# Patient Record
Sex: Male | Born: 1950 | Race: White | Hispanic: No | Marital: Single | Smoking: Never smoker
Health system: Southern US, Community
[De-identification: ages and names within clinical notes are randomized; demographics above are authoritative.]

## PROBLEM LIST (undated history)

## (undated) DIAGNOSIS — E119 Type 2 diabetes mellitus without complications: Secondary | ICD-10-CM

## (undated) DIAGNOSIS — M1990 Unspecified osteoarthritis, unspecified site: Secondary | ICD-10-CM

## (undated) DIAGNOSIS — H409 Unspecified glaucoma: Secondary | ICD-10-CM

## (undated) DIAGNOSIS — I2510 Atherosclerotic heart disease of native coronary artery without angina pectoris: Secondary | ICD-10-CM

## (undated) DIAGNOSIS — H269 Unspecified cataract: Secondary | ICD-10-CM

## (undated) DIAGNOSIS — R200 Anesthesia of skin: Secondary | ICD-10-CM

## (undated) DIAGNOSIS — I10 Essential (primary) hypertension: Secondary | ICD-10-CM

## (undated) DIAGNOSIS — E785 Hyperlipidemia, unspecified: Secondary | ICD-10-CM

## (undated) DIAGNOSIS — I428 Other cardiomyopathies: Secondary | ICD-10-CM

## (undated) DIAGNOSIS — K449 Diaphragmatic hernia without obstruction or gangrene: Secondary | ICD-10-CM

## (undated) DIAGNOSIS — F32A Depression, unspecified: Secondary | ICD-10-CM

## (undated) DIAGNOSIS — I5042 Chronic combined systolic (congestive) and diastolic (congestive) heart failure: Secondary | ICD-10-CM

## (undated) DIAGNOSIS — E1140 Type 2 diabetes mellitus with diabetic neuropathy, unspecified: Secondary | ICD-10-CM

## (undated) DIAGNOSIS — R2689 Other abnormalities of gait and mobility: Secondary | ICD-10-CM

## (undated) DIAGNOSIS — K222 Esophageal obstruction: Secondary | ICD-10-CM

## (undated) HISTORY — PX: COLONOSCOPY: SHX174

## (undated) HISTORY — DX: Unspecified osteoarthritis, unspecified site: M19.90

## (undated) HISTORY — DX: Depression, unspecified: F32.A

## (undated) HISTORY — DX: Atherosclerotic heart disease of native coronary artery without angina pectoris: I25.10

## (undated) HISTORY — DX: Anesthesia of skin: R20.0

## (undated) HISTORY — DX: Type 2 diabetes mellitus without complications: E11.9

## (undated) HISTORY — PX: GANGLION CYST EXCISION: SHX1691

## (undated) HISTORY — DX: Diaphragmatic hernia without obstruction or gangrene: K44.9

## (undated) HISTORY — PX: TONSILLECTOMY: SUR1361

## (undated) HISTORY — DX: Essential (primary) hypertension: I10

## (undated) HISTORY — DX: Hyperlipidemia, unspecified: E78.5

## (undated) HISTORY — PX: FRACTURE SURGERY: SHX138

## (undated) HISTORY — PX: CYST REMOVAL NECK: SHX6281

## (undated) HISTORY — DX: Unspecified glaucoma: H40.9

## (undated) HISTORY — DX: Esophageal obstruction: K22.2

## (undated) HISTORY — DX: Other cardiomyopathies: I42.8

## (undated) HISTORY — DX: Chronic combined systolic (congestive) and diastolic (congestive) heart failure: I50.42

## (undated) HISTORY — DX: Unspecified cataract: H26.9

## (undated) HISTORY — DX: Other abnormalities of gait and mobility: R26.89

---
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
## 2018-10-03 ENCOUNTER — Encounter: Payer: Self-pay | Admitting: Family Medicine

## 2018-10-03 ENCOUNTER — Ambulatory Visit (INDEPENDENT_AMBULATORY_CARE_PROVIDER_SITE_OTHER): Payer: Medicare Other | Admitting: Family Medicine

## 2018-10-03 ENCOUNTER — Ambulatory Visit (INDEPENDENT_AMBULATORY_CARE_PROVIDER_SITE_OTHER): Payer: Medicare Other

## 2018-10-03 ENCOUNTER — Encounter

## 2018-10-03 VITALS — BP 141/87 | HR 73 | Resp 18 | Ht 63.5 in | Wt 128.4 lb

## 2018-10-03 DIAGNOSIS — R6889 Other general symptoms and signs: Secondary | ICD-10-CM

## 2018-10-03 DIAGNOSIS — Z23 Encounter for immunization: Secondary | ICD-10-CM | POA: Diagnosis not present

## 2018-10-03 DIAGNOSIS — R2242 Localized swelling, mass and lump, left lower limb: Secondary | ICD-10-CM

## 2018-10-03 DIAGNOSIS — I10 Essential (primary) hypertension: Secondary | ICD-10-CM | POA: Diagnosis not present

## 2018-10-03 DIAGNOSIS — E1159 Type 2 diabetes mellitus with other circulatory complications: Secondary | ICD-10-CM | POA: Diagnosis not present

## 2018-10-03 DIAGNOSIS — R5381 Other malaise: Secondary | ICD-10-CM

## 2018-10-03 DIAGNOSIS — R202 Paresthesia of skin: Secondary | ICD-10-CM

## 2018-10-03 DIAGNOSIS — M898X6 Other specified disorders of bone, lower leg: Secondary | ICD-10-CM | POA: Diagnosis not present

## 2018-10-03 DIAGNOSIS — R413 Other amnesia: Secondary | ICD-10-CM

## 2018-10-03 DIAGNOSIS — Z1389 Encounter for screening for other disorder: Secondary | ICD-10-CM | POA: Diagnosis not present

## 2018-10-03 DIAGNOSIS — R2689 Other abnormalities of gait and mobility: Secondary | ICD-10-CM | POA: Diagnosis not present

## 2018-10-03 DIAGNOSIS — R200 Anesthesia of skin: Secondary | ICD-10-CM

## 2018-10-03 LAB — POCT URINALYSIS DIP (CLINITEK)
Bilirubin, UA: NEGATIVE
Blood, UA: NEGATIVE
Glucose, UA: NEGATIVE mg/dL
Ketones, POC UA: NEGATIVE mg/dL
Leukocytes, UA: NEGATIVE
Nitrite, UA: NEGATIVE
POC PROTEIN,UA: NEGATIVE
SPEC GRAV UA: 1.02 (ref 1.010–1.025)
Urobilinogen, UA: 0.2 E.U./dL
pH, UA: 5 (ref 5.0–8.0)

## 2018-10-03 LAB — GLUCOSE, POCT (MANUAL RESULT ENTRY): POC Glucose: 104 mg/dl — AB (ref 70–99)

## 2018-10-03 MED ORDER — LISINOPRIL 40 MG PO TABS: 40.0000 mg | ORAL_TABLET | Freq: Every day | ORAL | 2 refills | Status: DC

## 2018-10-03 MED ORDER — CARVEDILOL 3.125 MG PO TABS: 3.1250 mg | ORAL_TABLET | Freq: Two times a day (BID) | ORAL | 2 refills | Status: DC

## 2018-10-03 MED ORDER — METFORMIN HCL 1000 MG PO TABS: 1000.0000 mg | ORAL_TABLET | Freq: Two times a day (BID) | ORAL | 2 refills | Status: DC

## 2018-10-03 MED ORDER — ATORVASTATIN CALCIUM 10 MG PO TABS: 10.0000 mg | ORAL_TABLET | Freq: Every day | ORAL | 2 refills | Status: DC

## 2018-10-03 NOTE — Progress Notes (Signed)
Cody [name], is a 68 y.o. male  EUM:[id number]  VQM:[id number]  DOB - Jun 10, 1951  CC:  Chief Complaint  Patient presents with  . Establish Care  . Diabetes    numbness/tingling in feet, polydipsia, floaters  . Foot Pain    L foot pain. broke 2 toes 3 months ago.       HPI: Cody [name] is a 68 y.o. male is here today to establish care.   Cody [name] patient is a poor historian and has no available medical records present with him during visit today.  Is accompanied by his brother who brings a bag of medications.  He recently moved his brother down from [place] due to altered memory, impaired balance, frequent falling, poor self-care. He is awaiting receipt of Medicaid and family plans to place him in an assisted living facility.  Patient endorses forgetfulness and has been off medications for months.  He is currently prescribed medications for diabetes, hyperlipidemia, possible heart failure, hypertension.  At present he is only taking metformin.  He is uncertain of what his last hemoglobin A1c was.  He also complains of bilateral foot pain and numbness.  He dropped 2 fans on his left foot over 3 months ago and suffered a fracture to the fourth and fifth metatarsal.  Reports he saw someone about the fractures although no treatment was provided.  Reports being hospitalized for foot swelling although cannot provide any additional details regarding the hospitalization. He complains of persistent dizziness, weakness and numbness and angling of bilateral feet.  He denies polyuria polydipsia.  He has trouble eating stairs.  He is currently residing with his brother and has had multiple falls within the last 3 months.  Denies any head injury.   He denies chest pain, shortness of breath, persistent cough, chest tightness, denies history prior stroke, denies history of prior MI. Current medications: Current Outpatient Medications:  .  metFORMIN (GLUCOPHAGE) 1000 MG tablet, Take 1,000 mg by mouth 2  (two) times daily with a meal., Disp: , Rfl:  .  Ascorbic Acid (VITAMIN C) 1000 MG tablet, Take 1,000 mg by mouth daily., Disp: , Rfl:  .  atorvastatin (LIPITOR) 10 MG tablet, Take 10 mg by mouth daily., Disp: , Rfl:  .  B Complex-C (SUPER B COMPLEX PO), Take 1 capsule by mouth daily., Disp: , Rfl:  .  carvedilol (COREG) 3.125 MG tablet, Take 3.125 mg by mouth 2 (two) times daily with a meal., Disp: , Rfl:  .  Cholecalciferol (VITAMIN D-3) 25 MCG (1000 UT) CAPS, Take 1 capsule by mouth daily., Disp: , Rfl:  .  famotidine (PEPCID) 20 MG tablet, Take 20 mg by mouth 2 (two) times daily., Disp: , Rfl:  .  ibuprofen (ADVIL,MOTRIN) 600 MG tablet, Take 600 mg by mouth every 6 (six) hours as needed., Disp: , Rfl:  .  lisinopril (PRINIVIL,ZESTRIL) 40 MG tablet, Take 40 mg by mouth daily., Disp: , Rfl:  .  Omega-3 Fatty Acids (FISH OIL) 1200 MG CAPS, Take 1 capsule by mouth daily., Disp: , Rfl:  .  spironolactone (ALDACTONE) 25 MG tablet, Take 25 mg by mouth daily., Disp: , Rfl:    Pertinent family medical history: family history includes Diabetes in his brother; Hypertension in his brother.   No Known Allergies  Social History   Socioeconomic History  . Marital status: Single    Spouse name: Not on file  . Number of children: Not on file  . Years of education: Not on file  .  Highest education level: Not on file  Occupational History  . Not on file  Social Needs  . Financial resource strain: Not on file  . Food insecurity:    Worry: Not on file    Inability: Not on file  . Transportation needs:    Medical: Not on file    Non-medical: Not on file  Tobacco Use  . Smoking status: Never Smoker  . Smokeless tobacco: Current User    Types: Chew  Substance and Sexual Activity  . Alcohol use: Not Currently  . Drug use: Never  . Sexual activity: Not on file  Lifestyle  . Physical activity:    Days per week: Not on file    Minutes per session: Not on file  . Stress: Not on file   Relationships  . Social connections:    Talks on phone: Not on file    Gets together: Not on file    Attends religious service: Not on file    Active member of club or organization: Not on file    Attends meetings of clubs or organizations: Not on file    Relationship status: Not on file  . Intimate partner violence:    Fear of current or ex partner: Not on file    Emotionally abused: Not on file    Physically abused: Not on file    Forced sexual activity: Not on file  Other Topics Concern  . Not on file  Social History Narrative  . Not on file    Review of Systems: Pertinent negatives listed in HPI Objective:   Vitals:   10/03/18 0941  BP: (!) 150/93  Pulse: 73  Resp: 18  SpO2: 97%    BP Readings from Last 3 Encounters:  10/03/18 (!) 150/93    Filed Weights   10/03/18 0941  Weight: 128 lb 6.4 oz (58.2 kg)      Physical Exam: Constitutional: Patient appears well-developed and well-nourished. No distress. HENT: Normocephalic, atraumatic, External right and left ear normal.  Eyes: Conjunctivae and EOM are normal. PERRLA, no scleral icterus. Neck: Normal ROM. Neck supple. No JVD. No tracheal deviation. No thyromegaly. CVS: RRR, S1/S2 +, no murmurs, no gallops, no carotid bruit.  Pulmonary: Effort and breath sounds normal, no stridor, rhonchi, wheezes, rales.  Abdominal: Soft. BS +, no distension, tenderness, rebound or guarding.  Musculoskeletal: Edema noted left 4th and 5th digit. DP pulse +2  Neuro: Alert. Antalgic gait, BUE and BLE strength 5/5. Bilateral hand grips symmetrical. Skin: Skin is warm and dry. No rash noted. Not diaphoretic. No erythema. No pallor. Psychiatric: Normal mood and affect. Behavior, judgment, thought content normal.     Assessment and plan:  1. Essential hypertension, uncontrolled today -Resume lisinopril 40 mg. Hold aldactone pending review of labs. Continue carvedilol 3.125 BID.   We have discussed target BP range and blood pressure  goal. I have advised patient to check BP regularly and to call us back or report to clinic if the numbers are consistently higher than 140/90. We discussed the importance of compliance with medical therapy and DASH diet recommended, consequences of uncontrolled hypertension discussed.  Checking the following: - POCT URINALYSIS DIP (CLINITEK) - Recheck vitals  2. Type 2 diabetes mellitus with other circulatory complication, without long-term current use of insulin ([place]) For now continue metformin and recommend resuming Lipitor.  Checking the following: - Glucose (CBG) - Microalbumin/Creatinine Ratio, Urine - Hemoglobin A1c - Comprehensive metabolic panel - Lipid panel  3. Localized swelling of left  foot - DG Foot Complete Left; Future  4. Abnormality of gait due to impairment of balance 5. Memory impairment -Referral placed to neurology for further work-up   6. Alteration in self-care ability -Patient is awaiting Medicaid. Family will need FL-2   7. Bilateral foot numbness and tingling  A1C 5.6, well-controlled. Unknown cause of neuropathy symptoms. Referring to neurology for further evaluation.    Meds ordered this encounter  Medications  . metFORMIN (GLUCOPHAGE) 1000 MG tablet    Sig: Take 1 tablet (1,000 mg total) by mouth 2 (two) times daily with a meal.    Dispense:  60 tablet    Refill:  2  . lisinopril (PRINIVIL,ZESTRIL) 40 MG tablet    Sig: Take 1 tablet (40 mg total) by mouth daily.    Dispense:  30 tablet    Refill:  2  . carvedilol (COREG) 3.125 MG tablet    Sig: Take 1 tablet (3.125 mg total) by mouth 2 (two) times daily with a meal.    Dispense:  60 tablet    Refill:  2  . atorvastatin (LIPITOR) 10 MG tablet    Sig: Take 1 tablet (10 mg total) by mouth daily.    Dispense:  30 tablet    Refill:  2    Return in about 2 weeks (around 10/17/2018) for FL2 and medication follow-up .   The patient was given clear instructions to go to ER or return to medical  center if symptoms don't improve, worsen or new problems develop. The patient verbalized understanding. The patient was advised  to call and obtain lab results if they haven't heard anything from out office within 7-10 business days.  Cody Barrows, FNP Primary [facility] at [facility] [address], [place] [address] [phone]: [phone]    This note has been created with Dragon speech recognition software and Engineer, materials. Any transcriptional errors are unintentional.

## 2018-10-03 NOTE — Progress Notes (Signed)
Needs FL2 memory, gait problem, balance issues and medication problems

## 2018-10-04 LAB — MICROALBUMIN / CREATININE URINE RATIO
Creatinine, Urine: 121.3 mg/dL
Microalb/Creat Ratio: 56 mg/g creat — ABNORMAL HIGH (ref 0–29)
Microalbumin, Urine: 68.5 ug/mL

## 2018-10-04 LAB — COMPREHENSIVE METABOLIC PANEL
ALT: 26 IU/L (ref 0–44)
AST: 22 IU/L (ref 0–40)
Albumin/Globulin Ratio: 2 (ref 1.2–2.2)
Albumin: 4.6 g/dL (ref 3.8–4.8)
Alkaline Phosphatase: 79 IU/L (ref 39–117)
BUN/Creatinine Ratio: 24 (ref 10–24)
BUN: 24 mg/dL (ref 8–27)
Bilirubin Total: 0.5 mg/dL (ref 0.0–1.2)
CALCIUM: 10.1 mg/dL (ref 8.6–10.2)
CO2: 21 mmol/L (ref 20–29)
Chloride: 103 mmol/L (ref 96–106)
Creatinine, Ser: 1.02 mg/dL (ref 0.76–1.27)
GFR calc Af Amer: 87 mL/min/{1.73_m2} (ref >59)
GFR calc non Af Amer: 75 mL/min/{1.73_m2} (ref >59)
Globulin, Total: 2.3 g/dL (ref 1.5–4.5)
Glucose: 104 mg/dL — ABNORMAL HIGH (ref 65–99)
Potassium: 5 mmol/L (ref 3.5–5.2)
Sodium: 143 mmol/L (ref 134–144)
Total Protein: 6.9 g/dL (ref 6.0–8.5)

## 2018-10-04 LAB — LIPID PANEL
Chol/HDL Ratio: 6.4 ratio — ABNORMAL HIGH (ref 0.0–5.0)
Cholesterol, Total: 242 mg/dL — ABNORMAL HIGH (ref 100–199)
HDL: 38 mg/dL — ABNORMAL LOW (ref >39)
LDL Calculated: 161 mg/dL — ABNORMAL HIGH (ref 0–99)
Triglycerides: 215 mg/dL — ABNORMAL HIGH (ref 0–149)
VLDL Cholesterol Cal: 43 mg/dL — ABNORMAL HIGH (ref 5–40)

## 2018-10-04 LAB — HEMOGLOBIN A1C
Est. average glucose Bld gHb Est-mCnc: 114 mg/dL
Hgb A1c MFr Bld: 5.6 % (ref 4.8–5.6)

## 2018-10-09 DIAGNOSIS — R6889 Other general symptoms and signs: Secondary | ICD-10-CM | POA: Insufficient documentation

## 2018-10-09 DIAGNOSIS — I10 Essential (primary) hypertension: Secondary | ICD-10-CM | POA: Insufficient documentation

## 2018-10-09 DIAGNOSIS — R413 Other amnesia: Secondary | ICD-10-CM | POA: Insufficient documentation

## 2018-10-09 DIAGNOSIS — R2689 Other abnormalities of gait and mobility: Secondary | ICD-10-CM | POA: Insufficient documentation

## 2018-10-09 DIAGNOSIS — R2242 Localized swelling, mass and lump, left lower limb: Secondary | ICD-10-CM | POA: Insufficient documentation

## 2018-10-09 DIAGNOSIS — E119 Type 2 diabetes mellitus without complications: Secondary | ICD-10-CM | POA: Insufficient documentation

## 2018-10-09 DIAGNOSIS — R5381 Other malaise: Secondary | ICD-10-CM | POA: Insufficient documentation

## 2018-10-16 ENCOUNTER — Telehealth: Payer: Self-pay | Admitting: Neurology

## 2018-10-16 ENCOUNTER — Ambulatory Visit (INDEPENDENT_AMBULATORY_CARE_PROVIDER_SITE_OTHER): Payer: Medicare Other | Admitting: Neurology

## 2018-10-16 ENCOUNTER — Encounter: Payer: Self-pay | Admitting: Neurology

## 2018-10-16 VITALS — BP 135/75 | HR 78 | Ht 63.5 in | Wt 131.0 lb

## 2018-10-16 DIAGNOSIS — R413 Other amnesia: Secondary | ICD-10-CM

## 2018-10-16 DIAGNOSIS — E538 Deficiency of other specified B group vitamins: Secondary | ICD-10-CM | POA: Diagnosis not present

## 2018-10-16 DIAGNOSIS — R2689 Other abnormalities of gait and mobility: Secondary | ICD-10-CM

## 2018-10-16 DIAGNOSIS — R5382 Chronic fatigue, unspecified: Secondary | ICD-10-CM | POA: Diagnosis not present

## 2018-10-16 DIAGNOSIS — R269 Unspecified abnormalities of gait and mobility: Secondary | ICD-10-CM

## 2018-10-16 NOTE — Progress Notes (Signed)
Reason for visit: Gait disturbance, memory disturbance  Referring physician: Dr. [name] is a 68 y.o. male  History of present illness:  [name] is a 68 year old right-handed white male with a history of hypertension, dyslipidemia, and diabetes.  The patient has recently moved from [place] to this area to live with his brother.  The patient is a very poor historian, he has developed memory problems at some point, the brother believes that this is been a problem for least a year.  The patient goes on to say that he had an MRI of the brain in [place] several years ago for memory problems.  The patient has had some problems with his balance and walking that dates back at least a year as well, the patient claims he has been using a cane for that duration.  The patient has a walker as well, but he continues to fall on a regular basis when he is not using these devices.  The patient reports numbness in both feet without significant pain, he reports the numbness goes across the ankles.  He denies neck pain or low back pain.  He has not had any weakness of the extremities, he denies issues controlling the bowels or the bladder.  The patient was living alone prior to his move here, he was not taking his medications properly, he was not keeping up with appointments.  He was able to keep up with his finances.  He has not been operating a motor vehicle since being in this area.  The patient was seen by his primary care physician, blood work was done, hemoglobin A1c was 5.6.  The patient himself reports no episodes of hypoglycemia.  He comes to this office for an evaluation.  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  Past Medical History:  Diagnosis Date  . Balance problem   . Diabetes mellitus without complication ([place])   . Hyperlipidemia   . Hypertension   . Numbness     Past Surgical History:  Procedure Laterality Date  . CYST  REMOVAL NECK     States he was young  . FRACTURE SURGERY     Left foot   . GANGLION CYST EXCISION Left   . TONSILLECTOMY      Family History  Problem Relation Age of Onset  . Diabetes Brother   . Hypertension Brother   . Obesity Mother   . Heart failure Father     Social history:  reports that he has never smoked. His smokeless tobacco use includes chew. He reports previous alcohol use. He reports that he does not use drugs.  Medications:  Prior to Admission medications   Medication Sig Start Date End Date Taking? Authorizing Provider  Ascorbic Acid (VITAMIN C) 1000 MG tablet Take 1,000 mg by mouth daily.   Yes [provider]  atorvastatin (LIPITOR) 10 MG tablet Take 1 tablet (10 mg total) by mouth daily. 10/03/18  Yes Scot Jun, FNP  carvedilol (COREG) 3.125 MG tablet Take 1 tablet (3.125 mg total) by mouth 2 (two) times daily with a meal. 10/03/18  Yes Scot Jun, FNP  lisinopril (PRINIVIL,ZESTRIL) 40 MG tablet Take 1 tablet (40 mg total) by mouth daily. 10/03/18  Yes Scot Jun, FNP  metFORMIN (GLUCOPHAGE) 1000 MG tablet Take 1 tablet (1,000 mg total) by mouth 2 (two) times daily with a meal. 10/03/18  Yes Scot Jun, FNP  Omega-3 Fatty  Acids (FISH OIL) 1200 MG CAPS Take 1 capsule by mouth daily.   Yes [provider]  spironolactone (ALDACTONE) 25 MG tablet Take 25 mg by mouth daily. Hold medication pending labs    Yes [provider]     No Known Allergies  ROS:  Out of a complete 14 system review of symptoms, the patient complains only of the following symptoms, and all other reviewed systems are negative.  Gait disorder Memory problems  Blood pressure 135/75, pulse 78, height 5' 3.5" (1.613 m), weight 131 lb (59.4 kg).  Physical Exam  General: The patient is alert and cooperative at the time of the examination.  Eyes: Pupils are equal, round, and reactive to light. Discs are flat bilaterally.  Neck: The  neck is supple, no carotid bruits are noted.  Respiratory: The respiratory examination is clear.  Cardiovascular: The cardiovascular examination reveals a regular rate and rhythm, no obvious murmurs or rubs are noted.  Skin: Extremities are without significant edema.  Neurologic Exam  Mental status: The patient is alert and oriented x 3 at the time of the examination. The Mini-Mental status examination done today shows a total score of 21/30.  Cranial nerves: Facial symmetry is present. There is good sensation of the face to pinprick and soft touch bilaterally. The strength of the facial muscles and the muscles to head turning and shoulder shrug are normal bilaterally. Speech is slow, slightly dysarthric, not aphasic. Extraocular movements are full. Visual fields are full. The tongue is midline, and the patient has symmetric elevation of the soft palate. No obvious hearing deficits are noted.  Motor: The motor testing reveals 5 over 5 strength of all 4 extremities, with exception of some slight weakness of the intrinsic muscles of the left hand and hip flexion on the right. Good symmetric motor tone is noted throughout.  Sensory: Sensory testing is intact to pinprick, soft touch, vibration sensation, and position sense on the upper extremities.  With the lower extremities, there is no definite stocking pattern pinprick sensory deficit, the patient has symmetric vibration sensation in the feet, some impairment of position sense in both feet no evidence of extinction is noted.  Coordination: Cerebellar testing reveals good finger-nose-finger bilaterally, but the patient has dysmetria with heel-to-shin bilaterally.  Gait and station: Gait is wide-based, very unsteady, the patient cannot walk well independently.  Tandem gait was not attempted.  Romberg is negative.  Reflexes: Deep tendon reflexes are symmetric and normal bilaterally, with exception of depression of the ankle jerk reflexes  bilaterally. Toes are downgoing bilaterally.   Assessment/Plan:  1.  Memory disorder  2.  Gait disorder  3.  History of stuttering speech pattern  The patient has had some decline in his physical abilities to ambulate and with memory over the last year or 2.  The patient will be set up for blood work today, he will have MRI of the brain, he will undergo nerve conduction studies of both legs and EMG of one leg.  I have recommended physical therapy, the patient is not sure that he wants to pursue this.  He will follow-up in 4 months, we will consider addition of a medication for memory at that time.  Jill Alexanders MD 10/16/2018 3:47 PM  [facility] [address] [place], [place] [address]  Phone [phone] Fax [phone]

## 2018-10-16 NOTE — Telephone Encounter (Signed)
Medicare/medicaid order sent to GI. They will reach out to the pt to schedule.  °

## 2018-10-17 ENCOUNTER — Encounter: Payer: Self-pay | Admitting: Family Medicine

## 2018-10-17 ENCOUNTER — Telehealth: Payer: Self-pay

## 2018-10-17 ENCOUNTER — Ambulatory Visit (INDEPENDENT_AMBULATORY_CARE_PROVIDER_SITE_OTHER): Payer: Medicare Other | Admitting: Family Medicine

## 2018-10-17 VITALS — BP 144/75 | HR 63 | Resp 18 | Ht 63.5 in | Wt 131.0 lb

## 2018-10-17 DIAGNOSIS — E1159 Type 2 diabetes mellitus with other circulatory complications: Secondary | ICD-10-CM | POA: Diagnosis not present

## 2018-10-17 DIAGNOSIS — Z8669 Personal history of other diseases of the nervous system and sense organs: Secondary | ICD-10-CM

## 2018-10-17 DIAGNOSIS — F331 Major depressive disorder, recurrent, moderate: Secondary | ICD-10-CM

## 2018-10-17 DIAGNOSIS — E785 Hyperlipidemia, unspecified: Secondary | ICD-10-CM | POA: Diagnosis not present

## 2018-10-17 DIAGNOSIS — F411 Generalized anxiety disorder: Secondary | ICD-10-CM

## 2018-10-17 DIAGNOSIS — R7989 Other specified abnormal findings of blood chemistry: Secondary | ICD-10-CM

## 2018-10-17 LAB — SEDIMENTATION RATE: Sed Rate: 4 mm/hr (ref 0–30)

## 2018-10-17 LAB — TSH: TSH: 7.12 u[IU]/mL — ABNORMAL HIGH (ref 0.450–4.500)

## 2018-10-17 LAB — CBC WITH DIFFERENTIAL/PLATELET
Basophils Absolute: 0.1 10*3/uL (ref 0.0–0.2)
Basos: 1 %
EOS (ABSOLUTE): 0.3 10*3/uL (ref 0.0–0.4)
Eos: 4 %
Hematocrit: 38.1 % (ref 37.5–51.0)
Hemoglobin: 12.8 g/dL — ABNORMAL LOW (ref 13.0–17.7)
IMMATURE GRANULOCYTES: 0 %
Immature Grans (Abs): 0 10*3/uL (ref 0.0–0.1)
LYMPHS: 17 %
Lymphocytes Absolute: 1.2 10*3/uL (ref 0.7–3.1)
MCH: 33.3 pg — ABNORMAL HIGH (ref 26.6–33.0)
MCHC: 33.6 g/dL (ref 31.5–35.7)
MCV: 99 fL — ABNORMAL HIGH (ref 79–97)
Monocytes Absolute: 0.7 10*3/uL (ref 0.1–0.9)
Monocytes: 11 %
NEUTROS PCT: 67 %
Neutrophils Absolute: 4.6 10*3/uL (ref 1.4–7.0)
Platelets: 211 10*3/uL (ref 150–450)
RBC: 3.84 x10E6/uL — AB (ref 4.14–5.80)
RDW: 12.1 % (ref 11.6–15.4)
WBC: 6.8 10*3/uL (ref 3.4–10.8)

## 2018-10-17 LAB — RPR: RPR Ser Ql: NONREACTIVE

## 2018-10-17 LAB — HIV ANTIBODY (ROUTINE TESTING W REFLEX): HIV Screen 4th Generation wRfx: NONREACTIVE

## 2018-10-17 LAB — VITAMIN B12: Vitamin B-12: 370 pg/mL (ref 232–1245)

## 2018-10-17 LAB — GLUCOSE, POCT (MANUAL RESULT ENTRY): POC Glucose: 100 mg/dl — AB (ref 70–99)

## 2018-10-17 MED ORDER — CARVEDILOL 3.125 MG PO TABS: 3.1250 mg | ORAL_TABLET | Freq: Two times a day (BID) | ORAL | 2 refills | Status: DC

## 2018-10-17 MED ORDER — ATORVASTATIN CALCIUM 10 MG PO TABS: 10.0000 mg | ORAL_TABLET | Freq: Every day | ORAL | 2 refills | Status: DC

## 2018-10-17 MED ORDER — BUSPIRONE HCL 15 MG PO TABS: 15.0000 mg | ORAL_TABLET | Freq: Two times a day (BID) | ORAL | 1 refills | Status: DC

## 2018-10-17 MED ORDER — METFORMIN HCL 1000 MG PO TABS: 1000.0000 mg | ORAL_TABLET | Freq: Every day | ORAL | 2 refills | Status: DC

## 2018-10-17 MED ORDER — SPIRONOLACTONE 25 MG PO TABS: 25.0000 mg | ORAL_TABLET | Freq: Every day | ORAL | 1 refills | Status: DC

## 2018-10-17 MED ORDER — ESCITALOPRAM OXALATE 10 MG PO TABS: 10.0000 mg | ORAL_TABLET | Freq: Every day | ORAL | 0 refills | Status: DC

## 2018-10-17 MED ORDER — LISINOPRIL 40 MG PO TABS: 40.0000 mg | ORAL_TABLET | Freq: Every day | ORAL | 2 refills | Status: DC

## 2018-10-17 NOTE — Telephone Encounter (Signed)
I called pt and discussed his lab work with him. Pt reports that he just came from his PCP's office and is aware of these results. Pt verbalized understanding of results. Pt had no questions at this time but was encouraged to call back if questions arise.

## 2018-10-17 NOTE — Patient Instructions (Addendum)

## 2018-10-17 NOTE — Telephone Encounter (Signed)
-----   Message from Kathrynn Ducking, MD sent at 10/17/2018  7:57 AM EST ----- Blood work is unremarkable exception of a slightly low thyroid (elevated TSH), there appears to be a mild anemia.  These issues will need to be followed, I will send blood work to primary care physician.  Please call the patient. ----- Message ----- From: Lavone Neri Lab Results In Sent: 10/17/2018   7:38 AM EST To: Kathrynn Ducking, MD

## 2018-10-17 NOTE — Progress Notes (Signed)
Printed and discussed during office visit

## 2018-10-17 NOTE — Progress Notes (Signed)
Established Patient Office Visit  Subjective:  Patient ID: Cody [name], male    DOB: [birth year]  Age: 68 y.o. MRN: [id number]  CC:  Chief Complaint  Patient presents with  . Diabetes  . Hypertension  . Paperwork    FL2 Form    HPI Cody [name] presents for to review recent labs, medication refill request, and FL2. Patient's medicaid/medicare was recently approved. Family is requesting FL2 as they are planning to place patient in an assisted living facility. Patient is also requesting medication refills today as he now has Medicaid and once unable to get all of his prescriptions filled during last office visit.  He also complains of some generalized anxiety and some depression given current health status.  He reports a distant history of schizophrenia and bipolar disorder in which he was managed with lithium and Thorazine.  He reports he does not wish to get back on any medications which are the stronger nature.  He reports that these medications almost caused him to go "crazy".  He reports last medication that he has been on for depression was Lexapro and he has taken buspirone for anxiety.  He denies any abnormal adverse effects with these medications.  He reports that he is not suicidal although endorses some depression as he is currently living with his brother and unable to get around independently.  Symptoms of depression are also exacerbated by current level of neuropathic pain.  He has recently followed up with neurology and is currently undergoing testing to determine cause of loss of balance and neuropathic pain in bilateral feet.  Of note he recently had a thyroid panel checked in his recent neurology appointment which was found to be elevated.  He reports no known history of hypothyroidism.  Patient is also requesting a referral to ophthalmology as he has a history of glaucoma however has not had any recent follow-up for evaluation.  He currently has prescribed ophthalmic drops which he  is not using at the moment as he has not had any recent follow-up with ophthalmology. Past Medical History:  Diagnosis Date  . Balance problem   . Diabetes mellitus without complication ([place])   . Hyperlipidemia   . Hypertension   . Numbness     Past Surgical History:  Procedure Laterality Date  . CYST REMOVAL NECK     States he was young  . FRACTURE SURGERY     Left foot   . GANGLION CYST EXCISION Left   . TONSILLECTOMY      Family History  Problem Relation Age of Onset  . Diabetes Brother   . Hypertension Brother   . Obesity Mother   . Heart failure Father     Social History   Socioeconomic History  . Marital status: Single    Spouse name: Not on file  . Number of children: 0  . Years of education: Not on file  . Highest education level: 12th grade  Occupational History  . Occupation: Retired   Scientific laboratory technician  . Financial resource strain: Not on file  . Food insecurity:    Worry: Not on file    Inability: Not on file  . Transportation needs:    Medical: Not on file    Non-medical: Not on file  Tobacco Use  . Smoking status: Never Smoker  . Smokeless tobacco: Current User    Types: Chew  Substance and Sexual Activity  . Alcohol use: Not Currently  . Drug use: Never  . Sexual activity: Not  on file  Lifestyle  . Physical activity:    Days per week: Not on file    Minutes per session: Not on file  . Stress: Not on file  Relationships  . Social connections:    Talks on phone: Not on file    Gets together: Not on file    Attends religious service: Not on file    Active member of club or organization: Not on file    Attends meetings of clubs or organizations: Not on file    Relationship status: Not on file  . Intimate partner violence:    Fear of current or ex partner: Not on file    Emotionally abused: Not on file    Physically abused: Not on file    Forced sexual activity: Not on file  Other Topics Concern  . Not on file  Social History Narrative    Right handed    Caffeine 1-2 cups daily    Lives at home with brother Jenny Reichmann     Outpatient Medications Prior to Visit  Medication Sig Dispense Refill  . Ascorbic Acid (VITAMIN C) 1000 MG tablet Take 1,000 mg by mouth daily.    . B Complex-C (SUPER B COMPLEX PO) Take 1 capsule by mouth daily.    . Cholecalciferol (VITAMIN D3) 25 MCG (1000 UT) CAPS Take 1 capsule by mouth daily.    . Omega-3 Fatty Acids (FISH OIL) 1200 MG CAPS Take 1 capsule by mouth daily.    Marland Kitchen atorvastatin (LIPITOR) 10 MG tablet Take 1 tablet (10 mg total) by mouth daily. 30 tablet 2  . carvedilol (COREG) 3.125 MG tablet Take 1 tablet (3.125 mg total) by mouth 2 (two) times daily with a meal. 60 tablet 2  . lisinopril (PRINIVIL,ZESTRIL) 40 MG tablet Take 1 tablet (40 mg total) by mouth daily. 30 tablet 2  . metFORMIN (GLUCOPHAGE) 1000 MG tablet Take 1 tablet (1,000 mg total) by mouth 2 (two) times daily with a meal. 60 tablet 2  . spironolactone (ALDACTONE) 25 MG tablet Take 25 mg by mouth daily. Hold medication pending labs      No facility-administered medications prior to visit.     No Known Allergies  ROS Review of Systems Pertinent negatives listed in HPI  Objective:    Physical Exam BP (!) 144/75   Pulse 63   Resp 18   Ht 5' 3.5" (1.613 m)   Wt 131 lb (59.4 kg)   SpO2 95%   BMI 22.84 kg/m   General appearance: alert, well developed, well nourished, cooperative and in no distress Head: Normocephalic, without obvious abnormality, atraumatic Respiratory: Respirations even and unlabored, normal respiratory rate Heart: Regular heart rhythm and rate.  No murmurs or gallop rhythms noted. Extremities: No gross deformities Skin: Skin color, texture, turgor normal. No rashes seen  Psych: Appropriate mood and affect. Neurologic: Mental status: Alert, oriented to person, place, and time, thought content appropriate. Wt Readings from Last 3 Encounters:  10/17/18 131 lb (59.4 kg)  10/16/18 131 lb (59.4 kg)   10/03/18 128 lb 6.4 oz (58.2 kg)     Health Maintenance Due  Topic Date Due  . Hepatitis C Screening  [birth year]  . FOOT EXAM  08/10/1960  . OPHTHALMOLOGY EXAM  08/10/1960  . TETANUS/TDAP  08/10/1969  . COLONOSCOPY  08/10/2000  . INFLUENZA VACCINE  04/10/2018    There are no preventive care reminders to display for this patient.  Lab Results  Component Value Date   TSH 7.120 (  H) 10/16/2018   Lab Results  Component Value Date   WBC 6.8 10/16/2018   HGB 12.8 (L) 10/16/2018   HCT 38.1 10/16/2018   MCV 99 (H) 10/16/2018   PLT 211 10/16/2018   Lab Results  Component Value Date   NA 143 10/03/2018   K 5.0 10/03/2018   CO2 21 10/03/2018   GLUCOSE 104 (H) 10/03/2018   BUN 24 10/03/2018   CREATININE 1.02 10/03/2018   BILITOT 0.5 10/03/2018   ALKPHOS 79 10/03/2018   AST 22 10/03/2018   ALT 26 10/03/2018   PROT 6.9 10/03/2018   ALBUMIN 4.6 10/03/2018   CALCIUM 10.1 10/03/2018   Lab Results  Component Value Date   CHOL 242 (H) 10/03/2018   Lab Results  Component Value Date   HDL 38 (L) 10/03/2018   Lab Results  Component Value Date   LDLCALC 161 (H) 10/03/2018   Lab Results  Component Value Date   TRIG 215 (H) 10/03/2018   Lab Results  Component Value Date   CHOLHDL 6.4 (H) 10/03/2018   Lab Results  Component Value Date   HGBA1C 5.6 10/03/2018     Assessment & Plan:  1. Type 2 diabetes mellitus with other circulatory complication, without long-term current use of insulin (HCC) Most recent A1c is 5.6.  We will continue metformin as prescribed however will reduce dose to metformin 1000 mg once daily. - Glucose (CBG) - Ambulatory referral to Ophthalmology  2. GAD (generalized anxiety disorder) Will trial BuSpar 15 mg twice daily.  Patient was previously prescribed a twice daily dose.   Will adjust and titrate as warranted to reduce anxiety symptoms. 3. Moderate episode of recurrent major depressive disorder (HCC) -Currently active and recurrent.  Will  trial Lexapro 10 mg once daily.  Will titrate dose as indicated to achieve desired effect. 4. Elevated TSH Patient is returning within 5 weeks for repeat TSH.  If level remains elevated will start Synthroid.  Discussed with patient he verbalizes understanding.  Recent lipid panel was severely elevated this could be likely secondary to untreated and uncontrolled hypothyroidism.  We will also recheck a lipid panel in 5 weeks.  Patient is currently consistently taking medications as prescribed for hyperlipidemia. - Thyroid Panel With TSH; Future  5. Hyperlipidemia, unspecified hyperlipidemia type - Thyroid Panel With TSH; Future - Lipid panel; Future  6. Hx of glaucoma - Ambulatory referral to Ophthalmology  Meds ordered this encounter  Medications  . metFORMIN (GLUCOPHAGE) 1000 MG tablet    Sig: Take 1 tablet (1,000 mg total) by mouth daily with breakfast.    Dispense:  60 tablet    Refill:  2  . spironolactone (ALDACTONE) 25 MG tablet    Sig: Take 1 tablet (25 mg total) by mouth daily.    Dispense:  90 tablet    Refill:  1  . lisinopril (PRINIVIL,ZESTRIL) 40 MG tablet    Sig: Take 1 tablet (40 mg total) by mouth daily.    Dispense:  30 tablet    Refill:  2  . carvedilol (COREG) 3.125 MG tablet    Sig: Take 1 tablet (3.125 mg total) by mouth 2 (two) times daily with a meal.    Dispense:  60 tablet    Refill:  2  . atorvastatin (LIPITOR) 10 MG tablet    Sig: Take 1 tablet (10 mg total) by mouth daily.    Dispense:  30 tablet    Refill:  2  . escitalopram (LEXAPRO) 10 MG tablet  Sig: Take 1 tablet (10 mg total) by mouth at bedtime.    Dispense:  60 tablet    Refill:  0  . busPIRone (BUSPAR) 15 MG tablet    Sig: Take 1 tablet (15 mg total) by mouth 2 (two) times daily.    Dispense:  60 tablet    Refill:  1    Follow-up: Return in about 5 weeks (around 11/21/2018) for depression and anxiety and TSH /BP recheck .    Molli Barrows, FNP

## 2018-10-20 ENCOUNTER — Telehealth: Payer: Self-pay | Admitting: Neurology

## 2018-10-20 NOTE — Telephone Encounter (Signed)
I have received medical records from [facility].  MRI brain March 05, 2018:  Impression: There is no acute cerebral infarction  Mild chronic small vessel ischemic changes within the supratentorial white matter.  Mild cerebral atrophy.

## 2018-10-22 ENCOUNTER — Ambulatory Visit
Admission: RE | Admit: 2018-10-22 | Discharge: 2018-10-22 | Disposition: A | Discharge status: Home / Self Care | Payer: Medicare Other | Source: Ambulatory Visit | Attending: Neurology | Admitting: Neurology

## 2018-10-22 DIAGNOSIS — R269 Unspecified abnormalities of gait and mobility: Secondary | ICD-10-CM

## 2018-10-22 DIAGNOSIS — R413 Other amnesia: Secondary | ICD-10-CM

## 2018-10-23 ENCOUNTER — Telehealth: Payer: Self-pay | Admitting: Neurology

## 2018-10-23 NOTE — Telephone Encounter (Signed)
  I called the patient, talk with the brother.  The MRI of the brain shows minimal white matter changes, there may be some cerebellar atrophy by my view, otherwise nothing that would result in a significant gait disturbance.  The patient will be coming in for EMG nerve conduction study in the near future.  MRI brain 10/23/18:  IMPRESSION: Slightly abnormal MRI scan of the brain showing only mild age-related changes of chronic microvascular ischemia and generalized cerebral atrophy.

## 2018-11-06 ENCOUNTER — Ambulatory Visit (INDEPENDENT_AMBULATORY_CARE_PROVIDER_SITE_OTHER): Payer: Medicare Other | Admitting: Neurology

## 2018-11-06 DIAGNOSIS — G609 Hereditary and idiopathic neuropathy, unspecified: Secondary | ICD-10-CM | POA: Diagnosis not present

## 2018-11-06 DIAGNOSIS — R269 Unspecified abnormalities of gait and mobility: Secondary | ICD-10-CM

## 2018-11-06 DIAGNOSIS — R413 Other amnesia: Secondary | ICD-10-CM

## 2018-11-06 NOTE — Progress Notes (Addendum)
The patient comes in today for EMG nerve conduction study evaluation.  The nerve conduction study shows a peripheral neuropathy that clearly has some demyelinating features.  EMG does show some distal neuropathic denervation that is chronic, no acute denervation is seen.  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  The peripheral neuropathy likely is the primary source of the gait disorder.     [place]    Nerve / Sites Muscle Latency Ref. Amplitude Ref. Rel Amp Segments Distance Velocity Ref. Area    ms ms mV mV %  cm m/s m/s mVms  L Peroneal - EDB     Ankle EDB 9.0 ?6.5 0.2 ?2.0 100 Ankle - EDB 9   1.0     Fib head EDB 17.2  0.3  141 Fib head - Ankle 24 29 ?44 1.0     Pop fossa EDB 21.0  0.2  58 Pop fossa - Fib head 10 26 ?44 1.2         Pop fossa - Ankle      R Peroneal - EDB     Ankle EDB 11.7 ?6.5 0.3 ?2.0 100 Ankle - EDB 9   1.1     Fib head EDB 23.5  0.3  107 Fib head - Ankle 27 23 ?44 1.4     Pop fossa EDB 31.1  0.1  15.4 Pop fossa - Fib head 10 13 ?44 0.8         Pop fossa - Ankle      L Tibial - AH     Ankle AH 10.3 ?5.8 0.9 ?4.0 100 Ankle - AH 9   3.0     Pop fossa AH 21.5  0.4  37.8 Pop fossa - Ankle 32 29 ?41 2.3  R Tibial - AH     Ankle AH 9.3 ?5.8 1.2 ?4.0 100 Ankle - AH 9   6.0     Pop fossa AH 22.0  1.2  104 Pop fossa - Ankle 35 28 ?41              SNC    Nerve / Sites Rec. Site Peak Lat Ref.  Amp Ref. Segments Distance    ms ms V V  cm  L Sural - Ankle (Calf)     Calf Ankle NR ?4.4 NR ?6 Calf - Ankle 14  R Sural - Ankle (Calf)     Calf Ankle NR ?4.4 NR ?6 Calf - Ankle 14  L Superficial peroneal - Ankle     Lat leg Ankle NR ?4.4 NR ?6 Lat leg - Ankle 14  R Superficial peroneal - Ankle     Lat leg Ankle NR ?4.4 NR ?6 Lat leg - Ankle 14              F  Wave    Nerve F Lat Ref.   ms ms  L Tibial - AH 80.2 ?56.0  R Tibial - AH 71.8 ?56.0         EMG full     EMG Summary Table    Spontaneous MUAP Recruitment  Muscle IA Fib PSW Fasc Other Amp Dur. Poly Pattern  L. Abductor digiti minimi (manus) Normal None None None _______ Normal Normal Normal Normal

## 2018-11-06 NOTE — Procedures (Signed)
     HISTORY:  Cody [name] is a 68 year old gentleman with a history of progressive gait disorder who is being evaluated for a possible neuropathy as a source of the walking problems.  The patient is using a walker currently, he has not had any recent falls.  NERVE CONDUCTION STUDIES:  Nerve conduction studies were performed on both lower extremities.  The distal motor latencies for the peroneal and posterior tibial nerves were significantly prolonged bilaterally with low motor amplitudes for these nerves bilaterally.  Significant slowing was seen for these nerves in the teens and 20s for the peroneal and posterior tibial nerves bilaterally.  The sural and peroneal sensory latencies were unobtainable bilaterally.  The F-wave latencies for the posterior tibial nerves were significantly prolonged bilaterally.  EMG STUDIES:  EMG study was performed on the left lower extremity:  The tibialis anterior muscle reveals 2 to 4K motor units with slightly reduced recruitment. No fibrillations or positive waves were seen. The peroneus tertius muscle reveals 1 to 3K motor units with decreased recruitment. No fibrillations or positive waves were seen.  Polyphasic motor units were seen. The medial gastrocnemius muscle reveals 1 to 3K motor units with decreased recruitment. No fibrillations or positive waves were seen.  Polyphasic motor units were seen. The vastus lateralis muscle reveals 2 to 3K motor units with slightly decreased recruitment. No fibrillations or positive waves were seen.  Polyphasic motor units were seen. The iliopsoas muscle reveals 2 to 4K motor units with full recruitment. No fibrillations or positive waves were seen. The biceps femoris muscle (long head) reveals 2 to 4K motor units with full recruitment. No fibrillations or positive waves were seen. The lumbosacral paraspinal muscles were tested at 3 levels, and revealed no abnormalities of insertional activity at all 3 levels tested.  There was good relaxation.   IMPRESSION:  Nerve conduction studies were performed on both lower extremities and reveals evidence of a significant peripheral neuropathy with demyelinating features.  EMG evaluation of the left lower extremity showed distal primarily chronic neuropathic denervation consistent with the diagnosis of peripheral neuropathy.  No evidence of an overlying lumbosacral radiculopathy was found.  Jill Alexanders MD 11/06/2018 11:52 AM  [facility] [address] [place], [place] [address]  Phone [phone] Fax [phone]

## 2018-11-06 NOTE — Progress Notes (Signed)
Please refer to EMG and nerve conduction procedure note.  

## 2018-11-10 NOTE — Progress Notes (Signed)
[facility] Note  11/12/2018     CHIEF COMPLAINT Patient presents for Retina Evaluation and Diabetic Eye Exam   HISTORY OF PRESENT ILLNESS: Cody [name] is a 68 y.o. male who presents to the clinic today for:   HPI    Retina Evaluation    In both eyes.  This started 3 years ago.  Associated Symptoms Floaters.  Negative for Flashes, Pain, Trauma, Fever, Weight Loss, Scalp Tenderness, Redness, Distortion, Photophobia, Jaw Claudication, Fatigue, Shoulder/Hip pain, Glare and Blind Spot.  Context:  distance vision, mid-range vision and near vision.  Treatments tried include no treatments.  I, the attending physician,  performed the HPI with the patient and updated documentation appropriately.          Diabetic Eye Exam    Vision is stable.  Associated Symptoms Negative for Flashes, Pain, Trauma, Fever, Weight Loss, Scalp Tenderness, Redness, Floaters, Distortion, Photophobia, Jaw Claudication, Fatigue, Shoulder/Hip pain, Glare and Blind Spot.  Diabetes characteristics include Type 2 and taking oral medications.  This started 3 years ago.  Blood sugar level is controlled.  I, the attending physician,  performed the HPI with the patient and updated documentation appropriately.          Comments    Referral of Dr. Jorja Loa for DME. Patient states he has DM2 x 3 yrs, BS are not monitored at home, A1C 5. X 1 month ago, Bs are WINL per patient , Pt is taking Metformin. Pt reports he has hx of floaters ou. Pt denies flashes and ocular pain.       Last edited by Bernarda Caffey, MD on 11/15/2018 12:14 AM. (History)    pt states his PCP sent him here, he states he has a history of glaucoma that was being managed by a dr in [place], he states he was on a drop that he used once a day and had a green top, but he cannot remember the name of the drop of dr, pt states he just moved here and is trying to establish care with new drs, pt states he has diabetes also, pt denies having  any previous eye sx  Referring physician: Scot Jun, [address] [place], [place] [address]  HISTORICAL INFORMATION:   Selected notes from the [facility] Referred by Molli Barrows, FNP for DM exam LEE:  Ocular Hx- PMH-DM (takes metformin), HLD, HTN    CURRENT MEDICATIONS: No current outpatient medications on file. (Ophthalmic Drugs)   No current facility-administered medications for this visit.  (Ophthalmic Drugs)   Current Outpatient Medications (Other)  Medication Sig  . ALPRAZolam (XANAX) 0.5 MG tablet Take 2 tablets approximately 45 minutes prior to the lumbar puncture, take a third tablet if needed.  . Ascorbic Acid (VITAMIN C) 1000 MG tablet Take 1,000 mg by mouth daily.  Marland Kitchen atorvastatin (LIPITOR) 10 MG tablet Take 1 tablet (10 mg total) by mouth daily.  . B Complex-C (SUPER B COMPLEX PO) Take 1 capsule by mouth daily.  . busPIRone (BUSPAR) 15 MG tablet Take 1 tablet (15 mg total) by mouth 2 (two) times daily.  . carvedilol (COREG) 3.125 MG tablet Take 1 tablet (3.125 mg total) by mouth 2 (two) times daily with a meal.  . Cholecalciferol (VITAMIN D3) 25 MCG (1000 UT) CAPS Take 1 capsule by mouth daily.  Marland Kitchen escitalopram (LEXAPRO) 10 MG tablet Take 1 tablet (10 mg total) by mouth at bedtime.  Marland Kitchen lisinopril (PRINIVIL,ZESTRIL) 40 MG tablet  Take 1 tablet (40 mg total) by mouth daily.  . metFORMIN (GLUCOPHAGE) 1000 MG tablet Take 1 tablet (1,000 mg total) by mouth daily with breakfast.  . Omega-3 Fatty Acids (FISH OIL) 1200 MG CAPS Take 1 capsule by mouth daily.  Marland Kitchen spironolactone (ALDACTONE) 25 MG tablet Take 1 tablet (25 mg total) by mouth daily.   No current facility-administered medications for this visit.  (Other)      REVIEW OF SYSTEMS: ROS    Positive for: Endocrine, Eyes   Negative for: Constitutional, Gastrointestinal, Neurological, Skin, Genitourinary, Musculoskeletal, HENT, Cardiovascular, Respiratory, Psychiatric, Allergic/Imm,  Heme/Lymph   Last edited by Zenovia Jordan, LPN on 0/02/2375  2:83 AM. (History)       ALLERGIES No Known Allergies  PAST MEDICAL HISTORY Past Medical History:  Diagnosis Date  . Balance problem   . Diabetes mellitus without complication ([place])   . Hyperlipidemia   . Hypertension   . Numbness    Past Surgical History:  Procedure Laterality Date  . CYST REMOVAL NECK     States he was young  . FRACTURE SURGERY     Left foot   . GANGLION CYST EXCISION Left   . TONSILLECTOMY      FAMILY HISTORY Family History  Problem Relation Age of Onset  . Diabetes Brother   . Hypertension Brother   . Obesity Mother   . Heart failure Father     SOCIAL HISTORY Social History   Tobacco Use  . Smoking status: Never Smoker  . Smokeless tobacco: Current User    Types: Chew  Substance Use Topics  . Alcohol use: Not Currently  . Drug use: Never         OPHTHALMIC EXAM:  Base Eye Exam    Visual Acuity (Snellen - Linear)      Right Left   Dist [place] 20/70 +2 20/40   Dist ph [place] 20/25 20/25 -1       Tonometry (Tonopen, 8:51 AM)      Right Left   Pressure 19 23       Pupils      Dark Light Shape React APD   Right 4 3 Round Brisk None   Left 4 3 Round Brisk None       Visual Fields (Counting fingers)      Left Right    Full Full       Extraocular Movement      Right Left    Full, Ortho Full, Ortho       Neuro/Psych    Oriented x3:  Yes   Mood/Affect:  Normal       Dilation    Both eyes:  1.0% Mydriacyl, 2.5% Phenylephrine @ 8:51 AM        Slit Lamp and Fundus Exam    Slit Lamp Exam      Right Left   Lids/Lashes Dermatochalasis - upper lid, mild Meibomian gland dysfunction Dermatochalasis - upper lid, mild Meibomian gland dysfunction   Conjunctiva/Sclera White and quiet White and quiet   Cornea Arcus, 1+ Punctate epithelial erosions, mild endo pigment, Krukenberg's spindle Arcus, 1-2+ Punctate epithelial erosions, mild EBMD, Krukenberg's spindle    Anterior Chamber Deep and quiet Deep and quiet   Iris Round and dilated, No NVI Round and dilated, No NVI   Lens 2+ Nuclear sclerosis with early brunescence, 2+ Cortical cataract 2+ Nuclear sclerosis with early brunescence, 2+ Cortical cataract   Vitreous Vitreous syneresis, Posterior vitreous detachment Vitreous syneresis  Fundus Exam      Right Left   Disc Pink and Sharp Pink and Sharp   C/D Ratio 0.2 0.5   Macula Flat, Good foveal reflex, nasal Epiretinal membrane with striae  Good foveal reflex, No heme or edema   Vessels Tortuous, Copper wiring, AV crossing changes mild Vascular attenuation   Periphery Attached, no heme Attached, no heme        Refraction    Manifest Refraction      Sphere Cylinder Axis Dist VA   Right +1.25 +0.75 155 20/20-2   Left -0.25 +2.00 010 20/25+2          IMAGING AND PROCEDURES  Imaging and Procedures for @TODAY @  OCT, Retina - OU - Both Eyes       Right Eye Quality was good. Central Foveal Thickness: 345. Progression has no prior data. Findings include abnormal foveal contour, no IRF, no SRF, epiretinal membrane, macular pucker (ERM and macular pucker greatest nasally).   Left Eye Quality was good. Central Foveal Thickness: 315. Progression has no prior data. Findings include normal foveal contour, no SRF, no IRF.   Notes *Images captured and stored on drive  Diagnosis / Impression:  OD: nasal ERM with macular pucker OS: NFP, no IRF, SRF  Clinical management:  See below  Abbreviations: NFP - Normal foveal profile. CME - cystoid macular edema. PED - pigment epithelial detachment. IRF - intraretinal fluid. SRF - subretinal fluid. EZ - ellipsoid zone. ERM - epiretinal membrane. ORA - outer retinal atrophy. ORT - outer retinal tubulation. SRHM - subretinal hyper-reflective material                 ASSESSMENT/PLAN:    ICD-10-CM   1. Diabetes mellitus type 2 without retinopathy ([place]) E11.9   2. Epiretinal membrane (ERM)  of right eye H35.371   3. Retinal edema H35.81 OCT, Retina - OU - Both Eyes  4. Essential hypertension I10   5. Hypertensive retinopathy of both eyes H35.033   6. 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 Diabetes mellitus, type 2 without retinopathy  - The incidence, risk factors for progression, natural history and treatment options for diabetic retinopathy  were discussed with patient.    - The need for close monitoring of blood glucose, blood pressure, and serum lipids, avoiding cigarette or any type of tobacco, and the need for long term follow up was also discussed with patient.  - f/u in 1 year, sooner prn  2,3. Epiretinal membrane with macular pucker OD   - The natural history, anatomy, potential for loss of vision, and treatment options including vitrectomy techniques and the complications of endophthalmitis, retinal detachment, vitreous hemorrhage, cataract progression and permanent vision loss discussed with the patient.  - mild nasal ERM  - asymptomatic, no metamorphopsia  - no indication for surgery at this time  - monitor for now  - f/u 3-4 mos  4,5. Hypertensive retinopathy OU - discussed importance of tight BP control - monitor  6. 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 Kathlen Mody for glaucoma eval and management - given bottle of brimonidine in office to use BID OU until he sees Dr. Kathlen Mody  7. 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 - discussed diagnosis and progression - not yet visually significant - monitor for now   Ophthalmic  Meds Ordered this visit:  No orders of the defined types were placed in this encounter.      Return for f/u 3-4 months ERM OU, DFE, OCT.  There are no Patient Instructions on file for this visit.   Explained the  diagnoses, plan, and follow up with the patient and they expressed understanding.  Patient expressed understanding of the importance of proper follow up care.   This document serves as a record of services personally performed by Gardiner Sleeper, MD, PhD. It was created on their behalf by Ernest Mallick, OA, an ophthalmic assistant. The creation of this record is the provider's dictation and/or activities during the visit.    Electronically signed by: Ernest Mallick, OA  03.02.2020 12:15 AM    Gardiner Sleeper, M.D., Ph.D. Diseases & Surgery of the Retina and Vitreous [facility]  I have reviewed the above documentation for accuracy and completeness, and I agree with the above. Gardiner Sleeper, M.D., Ph.D. 11/15/18 12:16 AM     Abbreviations: M myopia (nearsighted); A astigmatism; H hyperopia (farsighted); P presbyopia; Mrx spectacle prescription;  CTL contact lenses; OD right eye; OS left eye; OU both eyes  XT exotropia; ET esotropia; PEK punctate epithelial keratitis; PEE punctate epithelial erosions; DES dry eye syndrome; MGD meibomian gland dysfunction; ATs artificial tears; PFAT's preservative free artificial tears; [place] nuclear sclerotic cataract; PSC posterior subcapsular cataract; ERM epi-retinal membrane; PVD posterior vitreous detachment; RD retinal detachment; DM diabetes mellitus; DR diabetic retinopathy; NPDR non-proliferative diabetic retinopathy; PDR proliferative diabetic retinopathy; CSME clinically significant macular edema; DME diabetic macular edema; dbh dot blot hemorrhages; CWS cotton wool spot; POAG primary open angle glaucoma; C/D cup-to-disc ratio; HVF humphrey visual field; GVF goldmann visual field; OCT optical coherence tomography; IOP intraocular pressure; BRVO Branch retinal vein occlusion; CRVO central retinal vein occlusion; CRAO central retinal artery occlusion; BRAO branch retinal artery occlusion; RT retinal tear; SB scleral buckle; PPV pars plana  vitrectomy; VH Vitreous hemorrhage; PRP panretinal laser photocoagulation; IVK intravitreal kenalog; VMT vitreomacular traction; MH Macular hole;  NVD neovascularization of the disc; NVE neovascularization elsewhere; AREDS age related eye disease study; ARMD age related macular degeneration; POAG primary open angle glaucoma; EBMD epithelial/anterior basement membrane dystrophy; ACIOL anterior chamber intraocular lens; IOL intraocular lens; PCIOL posterior chamber intraocular lens; Phaco/IOL phacoemulsification with intraocular lens placement; [place] photorefractive keratectomy; LASIK laser assisted in situ keratomileusis; HTN hypertension; DM diabetes mellitus; COPD chronic obstructive pulmonary disease

## 2018-11-11 ENCOUNTER — Telehealth: Payer: Self-pay | Admitting: Neurology

## 2018-11-11 DIAGNOSIS — G6181 Chronic inflammatory demyelinating polyneuritis: Secondary | ICD-10-CM

## 2018-11-11 LAB — MULTIPLE MYELOMA PANEL, SERUM
Albumin SerPl Elph-Mcnc: 3.8 g/dL (ref 2.9–4.4)
Albumin/Glob SerPl: 1.2 (ref 0.7–1.7)
Alpha 1: 0.2 g/dL (ref 0.0–0.4)
Alpha2 Glob SerPl Elph-Mcnc: 0.9 g/dL (ref 0.4–1.0)
B-Globulin SerPl Elph-Mcnc: 1.2 g/dL (ref 0.7–1.3)
Gamma Glob SerPl Elph-Mcnc: 1 g/dL (ref 0.4–1.8)
Globulin, Total: 3.3 g/dL (ref 2.2–3.9)
IgA/Immunoglobulin A, Serum: 268 mg/dL (ref 61–437)
IgG (Immunoglobin G), Serum: 1044 mg/dL (ref 700–1600)
IgM (Immunoglobulin M), Srm: 66 mg/dL (ref 20–172)
Total Protein: 7.1 g/dL (ref 6.0–8.5)

## 2018-11-11 LAB — MAG INTERPRETATION REFLEXED

## 2018-11-11 LAB — ANGIOTENSIN CONVERTING ENZYME: Angio Convert Enzyme: 5 U/L — ABNORMAL LOW (ref 14–82)

## 2018-11-11 LAB — B. BURGDORFI ANTIBODIES: Lyme IgG/IgM Ab: <0.91 {ISR} (ref 0.00–0.90)

## 2018-11-11 LAB — MAG IGM ANTIBODIES: MAG IgM Antibodies: <444 BTU (ref 0–999)

## 2018-11-11 LAB — ANA W/REFLEX: Anti Nuclear Antibody(ANA): NEGATIVE

## 2018-11-11 LAB — RHEUMATOID FACTOR: Rheumatoid fact SerPl-aCnc: <10 IU/mL (ref 0.0–13.9)

## 2018-11-11 MED ORDER — ALPRAZOLAM 0.5 MG PO TABS: ORAL_TABLET | ORAL | 0 refills | Status: DC

## 2018-11-11 NOTE — Telephone Encounter (Signed)
I called the patient.  The blood work is unremarkable, we will pursue lumbar puncture, Xanax will be sent in for anxiety prior to the procedure.  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

## 2018-11-12 ENCOUNTER — Encounter (INDEPENDENT_AMBULATORY_CARE_PROVIDER_SITE_OTHER): Payer: Self-pay | Admitting: Ophthalmology

## 2018-11-12 ENCOUNTER — Ambulatory Visit (INDEPENDENT_AMBULATORY_CARE_PROVIDER_SITE_OTHER): Payer: Medicare Other | Admitting: Ophthalmology

## 2018-11-12 DIAGNOSIS — I10 Essential (primary) hypertension: Secondary | ICD-10-CM | POA: Diagnosis not present

## 2018-11-12 DIAGNOSIS — E119 Type 2 diabetes mellitus without complications: Secondary | ICD-10-CM | POA: Diagnosis not present

## 2018-11-12 DIAGNOSIS — H40053 Ocular hypertension, bilateral: Secondary | ICD-10-CM

## 2018-11-12 DIAGNOSIS — H25813 Combined forms of age-related cataract, bilateral: Secondary | ICD-10-CM

## 2018-11-12 DIAGNOSIS — H3581 Retinal edema: Secondary | ICD-10-CM | POA: Diagnosis not present

## 2018-11-12 DIAGNOSIS — H35371 Puckering of macula, right eye: Secondary | ICD-10-CM

## 2018-11-12 DIAGNOSIS — H35033 Hypertensive retinopathy, bilateral: Secondary | ICD-10-CM

## 2018-11-15 ENCOUNTER — Encounter (INDEPENDENT_AMBULATORY_CARE_PROVIDER_SITE_OTHER): Payer: Self-pay | Admitting: Ophthalmology

## 2018-11-19 ENCOUNTER — Other Ambulatory Visit: Payer: Self-pay

## 2018-11-19 ENCOUNTER — Telehealth: Payer: Self-pay | Admitting: Neurology

## 2018-11-19 ENCOUNTER — Ambulatory Visit
Admission: RE | Admit: 2018-11-19 | Discharge: 2018-11-19 | Disposition: A | Discharge status: Home / Self Care | Payer: Medicare Other | Source: Ambulatory Visit | Attending: Neurology | Admitting: Neurology

## 2018-11-19 VITALS — BP 131/75 | HR 74

## 2018-11-19 DIAGNOSIS — G6181 Chronic inflammatory demyelinating polyneuritis: Secondary | ICD-10-CM

## 2018-11-19 DIAGNOSIS — R2689 Other abnormalities of gait and mobility: Secondary | ICD-10-CM

## 2018-11-19 DIAGNOSIS — R413 Other amnesia: Secondary | ICD-10-CM

## 2018-11-19 LAB — GLUCOSE, CSF: Glucose, CSF: 76 mg/dL (ref 40–80)

## 2018-11-19 LAB — CSF CELL COUNT WITH DIFFERENTIAL
RBC Count, CSF: 1 cells/uL (ref 0–10)
WBC CSF: 0 {cells}/uL (ref 0–5)

## 2018-11-19 LAB — PROTEIN, CSF: TOTAL PROTEIN, CSF: 51 mg/dL (ref 15–60)

## 2018-11-19 NOTE — Telephone Encounter (Signed)
I called the patient.  The spinal fluid results revealed a completely normal spinal fluid protein going against the diagnosis of CIDP.  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  I therefore would not pursue further treatment with IVIG.

## 2018-11-19 NOTE — Discharge Instructions (Signed)

## 2018-11-20 ENCOUNTER — Other Ambulatory Visit: Payer: Self-pay | Admitting: Family Medicine

## 2018-11-21 ENCOUNTER — Encounter: Payer: Self-pay | Admitting: Family Medicine

## 2018-11-21 ENCOUNTER — Other Ambulatory Visit: Payer: Self-pay

## 2018-11-21 ENCOUNTER — Ambulatory Visit (INDEPENDENT_AMBULATORY_CARE_PROVIDER_SITE_OTHER): Payer: Medicare Other | Admitting: Family Medicine

## 2018-11-21 VITALS — BP 106/65 | HR 58 | Resp 17 | Ht 63.5 in | Wt 130.6 lb

## 2018-11-21 DIAGNOSIS — F411 Generalized anxiety disorder: Secondary | ICD-10-CM | POA: Diagnosis not present

## 2018-11-21 DIAGNOSIS — R7989 Other specified abnormal findings of blood chemistry: Secondary | ICD-10-CM | POA: Diagnosis not present

## 2018-11-21 DIAGNOSIS — H6123 Impacted cerumen, bilateral: Secondary | ICD-10-CM

## 2018-11-21 DIAGNOSIS — H9193 Unspecified hearing loss, bilateral: Secondary | ICD-10-CM

## 2018-11-21 DIAGNOSIS — F331 Major depressive disorder, recurrent, moderate: Secondary | ICD-10-CM

## 2018-11-21 MED ORDER — CARBAMIDE PEROXIDE 6.5 % OT SOLN: 5.0000 [drp] | Freq: Two times a day (BID) | OTIC | 0 refills | Status: AC

## 2018-11-21 NOTE — Patient Instructions (Signed)
Earwax Buildup, Adult  The ears produce a substance called earwax that helps keep bacteria out of the ear and protects the skin in the ear canal. Occasionally, earwax can build up in the ear and cause discomfort or hearing loss.  What increases the risk?  This condition is more likely to develop in people who:  · Are male.  · Are elderly.  · Naturally produce more earwax.  · Clean their ears often with cotton swabs.  · Use earplugs often.  · Use in-ear headphones often.  · Wear hearing aids.  · Have narrow ear canals.  · Have earwax that is overly thick or sticky.  · Have eczema.  · Are dehydrated.  · Have excess hair in the ear canal.  What are the signs or symptoms?  Symptoms of this condition include:  · Reduced or muffled hearing.  · A feeling of fullness in the ear or feeling that the ear is plugged.  · Fluid coming from the ear.  · Ear pain.  · Ear itch.  · Ringing in the ear.  · Coughing.  · An obvious piece of earwax that can be seen inside the ear canal.  How is this diagnosed?  This condition may be diagnosed based on:  · Your symptoms.  · Your medical history.  · An ear exam. During the exam, your health care provider will look into your ear with an instrument called an otoscope.  You may have tests, including a hearing test.  How is this treated?  This condition may be treated by:  · Using ear drops to soften the earwax.  · Having the earwax removed by a health care provider. The health care provider may:  ? Flush the ear with water.  ? Use an instrument that has a loop on the end (curette).  ? Use a suction device.  · Surgery to remove the wax buildup. This may be done in severe cases.  Follow these instructions at home:    · Take over-the-counter and prescription medicines only as told by your health care provider.  · Do not put any objects, including cotton swabs, into your ear. You can clean the opening of your ear canal with a washcloth or facial tissue.  · Follow instructions from your health care  provider about cleaning your ears. Do not over-clean your ears.  · Drink enough fluid to keep your urine clear or pale yellow. This will help to thin the earwax.  · Keep all follow-up visits as told by your health care provider. If earwax builds up in your ears often or if you use hearing aids, consider seeing your health care provider for routine, preventive ear cleanings. Ask your health care provider how often you should schedule your cleanings.  · If you have hearing aids, clean them according to instructions from the manufacturer and your health care provider.  Contact a health care provider if:  · You have ear pain.  · You develop a fever.  · You have blood, pus, or other fluid coming from your ear.  · You have hearing loss.  · You have ringing in your ears that does not go away.  · Your symptoms do not improve with treatment.  · You feel like the room is spinning (vertigo).  Summary  · Earwax can build up in the ear and cause discomfort or hearing loss.  · The most common symptoms of this condition include reduced or muffled hearing and a feeling of   fullness in the ear or feeling that the ear is plugged.  · This condition may be diagnosed based on your symptoms, your medical history, and an ear exam.  · This condition may be treated by using ear drops to soften the earwax or by having the earwax removed by a health care provider.  · Do not put any objects, including cotton swabs, into your ear. You can clean the opening of your ear canal with a washcloth or facial tissue.  This information is not intended to replace advice given to you by your health care provider. Make sure you discuss any questions you have with your health care provider.  Document Released: 10/04/2004 Document Revised: 08/08/2017 Document Reviewed: 11/07/2016  Elsevier Interactive Patient Education © 2019 Elsevier Inc.

## 2018-11-21 NOTE — Progress Notes (Signed)
Patient ID: Cody [name], male    DOB: 1951-02-13, 68 y.o.   MRN: [id number]  PCP: Scot Jun, FNP  Chief Complaint  Patient presents with  . Depression  . Anxiety    Subjective:  HPI Cody [name] is a 68 y.o. male presents for evaluation anxiety and depression. Today, patient reports that he is free of all symptoms of both anxiety and depression. He is anxious to receive results of recent lumbar puncture performed by Neurologist, Dr. Jannifer Franklin. 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 He is accompanied by his brother who has unsuccessfully been able to find him housing. He has an active application with the [employer] program and will know next week if patient has been accepted. Patient today will also have a repeat TSH today.  Patient also complains of gradually worsens bilateral hearing. Reports impaired hearing in right ear for several years and now diminished hearing in left ear. No audiologic exam. Social History   Socioeconomic History  . Marital status: Single    Spouse name: Not on file  . Number of children: 0  . Years of education: Not on file  . Highest education level: 12th grade  Occupational History  . Occupation: Retired   Scientific laboratory technician  . Financial resource strain: Not on file  . Food insecurity:    Worry: Not on file    Inability: Not on file  . Transportation needs:    Medical: Not on file    Non-medical: Not on file  Tobacco Use  . Smoking status: Never Smoker  . Smokeless tobacco: Current User    Types: Chew  Substance and Sexual Activity  . Alcohol use: Not Currently  . Drug use: Never  . Sexual activity: Not on file  Lifestyle  . Physical activity:    Days per week: Not on file    Minutes per session: Not on file  . Stress: Not on file  Relationships  . Social connections:    Talks on phone: Not on file    Gets together: Not on file    Attends religious service: Not on file    Active member of club or organization: Not on  file    Attends meetings of clubs or organizations: Not on file    Relationship status: Not on file  . Intimate partner violence:    Fear of current or ex partner: Not on file    Emotionally abused: Not on file    Physically abused: Not on file    Forced sexual activity: Not on file  Other Topics Concern  . Not on file  Social History Narrative   Right handed    Caffeine 1-2 cups daily    Lives at home with brother Cody [name]     Family History  Problem Relation Age of Onset  . Diabetes Brother   . Hypertension Brother   . Obesity Mother   . Heart failure Father      Review of Systems Pertinent negatives listed in HPI Patient Active Problem List   Diagnosis Date Noted  . Diabetes mellitus ([place]) 10/09/2018  . Localized swelling of left foot 10/09/2018  . Abnormality of gait due to impairment of balance 10/09/2018  . Alteration in self-care ability 10/09/2018  . Essential hypertension 10/09/2018  . Memory impairment 10/09/2018    No Known Allergies  Prior to Admission medications   Medication Sig Start Date End Date Taking? Authorizing Provider  ALPRAZolam Duanne Moron) 0.5 MG tablet Take 2 tablets approximately 45  minutes prior to the lumbar puncture, take a third tablet if needed. 11/11/18  Yes Kathrynn Ducking, MD  Ascorbic Acid (VITAMIN C) 1000 MG tablet Take 1,000 mg by mouth daily.   Yes [provider]  atorvastatin (LIPITOR) 10 MG tablet Take 1 tablet (10 mg total) by mouth daily. 10/17/18  Yes Scot Jun, FNP  B Complex-C (SUPER B COMPLEX PO) Take 1 capsule by mouth daily.   Yes [provider]  busPIRone (BUSPAR) 15 MG tablet TAKE 1 TABLET (15 MG TOTAL) BY MOUTH 2 (TWO) TIMES DAILY. 11/20/18  Yes Scot Jun, FNP  carvedilol (COREG) 3.125 MG tablet Take 1 tablet (3.125 mg total) by mouth 2 (two) times daily with a meal. 10/17/18  Yes Scot Jun, FNP  Cholecalciferol (VITAMIN D3) 25 MCG (1000 UT) CAPS Take 1 capsule by mouth daily.   Yes  [provider]  escitalopram (LEXAPRO) 10 MG tablet Take 1 tablet (10 mg total) by mouth at bedtime. 10/17/18  Yes Scot Jun, FNP  lisinopril (PRINIVIL,ZESTRIL) 40 MG tablet Take 1 tablet (40 mg total) by mouth daily. 10/17/18  Yes Scot Jun, FNP  metFORMIN (GLUCOPHAGE) 1000 MG tablet Take 1 tablet (1,000 mg total) by mouth daily with breakfast. 10/17/18  Yes Scot Jun, FNP  Omega-3 Fatty Acids (FISH OIL) 1200 MG CAPS Take 1 capsule by mouth daily.   Yes [provider]  spironolactone (ALDACTONE) 25 MG tablet Take 1 tablet (25 mg total) by mouth daily. 10/17/18  Yes Scot Jun, FNP  carbamide peroxide (DEBROX) 6.5 % OTIC solution Place 5 drops into both ears 2 (two) times daily for 5 days. 11/21/18 11/26/18  Scot Jun, FNP    Past Medical, Surgical Family and Social History reviewed and updated.    Objective:   Today's Vitals   11/21/18 1134  BP: 106/65  Pulse: (!) 58  Resp: 17  SpO2: 97%  Weight: 130 lb 9.6 oz (59.2 kg)  Height: 5' 3.5" (1.613 m)    Wt Readings from Last 3 Encounters:  11/21/18 130 lb 9.6 oz (59.2 kg)  10/17/18 131 lb (59.4 kg)  10/16/18 131 lb (59.4 kg)     Physical Exam General appearance: alert, cooperative and in no distress Head: Normocephalic, without obvious abnormality, atraumatic Respiratory: Respirations even and unlabored, normal respiratory rate Heart: rate and rhythm normal. No gallop or murmurs noted on exam  Extremities: Gait instability  Skin: Skin color, texture, turgor normal. No rashes seen  Psych: Appropriate mood and affect. Neurologic: Mental status: Alert, oriented to person, place, and time, thought content appropriate.  Lab Results  Component Value Date   POCGLU 100 (A) 10/17/2018   POCGLU 104 (A) 10/03/2018    Lab Results  Component Value Date   HGBA1C 5.6 10/03/2018        Assessment & Plan:  1. Elevated TSH - Thyroid Panel With TSH, unable to obtain lab specimen on  multiple attempts today  2. Bilateral hearing loss, unspecified hearing loss type -Referred to audiology  3. Bilateral impacted cerumen -Patient will need an audiologic exam. -Ears are both significant for cerumen, debrox prescribed and recommended return for bilateral ear irrigation   4. Moderate episode of recurrent major depressive disorder ([place]) -Patient denies active symptoms today and is actively taking prescribed medication -No changes today  5. GAD (generalized anxiety disorder) -Patient denies symptoms today, continue current medication   Patient stuck multiple times, lab personnel unable to obtain blood. Will  repeat TSH at next visit.  Completing [employer] bus application to assist with transportation  -The patient was given clear instructions to go to ER or return to medical center if symptoms do not improve, worsen or new problems develop. The patient verbalized understanding.    Molli Barrows, FNP Primary Care at [facility] [address], [place] [phone]: [phone]

## 2018-11-24 ENCOUNTER — Telehealth: Payer: Self-pay | Admitting: Family Medicine

## 2018-11-24 ENCOUNTER — Telehealth: Payer: Self-pay

## 2018-11-24 NOTE — Telephone Encounter (Signed)
I called the patient's brother.  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  The chances that the IVIG would be helpful are much less, therefore I would not recommend this therapy.  The patient now has been approved for [facility], he may benefit from physical therapy for gait training, try to minimize risk for falls.

## 2018-11-24 NOTE — Telephone Encounter (Signed)
There AVS indicated to use ear medication for 5 days and schedule an ear irrigation nurse visit, if problem did not resolve. You can schedule 11:45 nurse visit on Friday for ear irrigation.

## 2018-11-24 NOTE — Telephone Encounter (Signed)
Patient's brother called stating that they have been using the ear medication for the past 5 days but that it is not helping. Patient would like to know what else he can do, please follow up.

## 2018-11-24 NOTE — Telephone Encounter (Signed)
Spoke to patient's brother.

## 2018-11-24 NOTE — Telephone Encounter (Signed)
Paperwork received on 11/21/2018  Type of paperwork: [facility] paperwork  Route received: Patient left at office   Has patient completed their portion of the paperwork: yes   Has office staff updated demographics of paperwork: yes   Paperwork routed to provider : yes

## 2018-11-24 NOTE — Telephone Encounter (Signed)
[facility] provider portion complete. Please scan copy and mail originals to [facility].

## 2018-11-24 NOTE — Telephone Encounter (Signed)
Patients brother called in to get results from spinal tap , patient isnt unsure of results given to him . Pts brother is on [place].  CB # [phone]

## 2018-11-25 NOTE — Telephone Encounter (Signed)
Copy was made to send to scan, originals were faxed to [facility] [phone]

## 2018-11-28 ENCOUNTER — Ambulatory Visit (INDEPENDENT_AMBULATORY_CARE_PROVIDER_SITE_OTHER): Payer: Medicare Other | Admitting: Family Medicine

## 2018-11-28 ENCOUNTER — Other Ambulatory Visit: Payer: Self-pay

## 2018-11-28 DIAGNOSIS — H6123 Impacted cerumen, bilateral: Secondary | ICD-10-CM

## 2018-11-28 NOTE — Progress Notes (Signed)
Ceruminosis is noted.  Wax is removed with elephant ear device. No redness or pain noted ear canal after the procedure.  Pt tolerated procedure well. Informed that referral was placed to Audiologist.

## 2018-11-28 NOTE — Progress Notes (Signed)
Ear irrigation only. Nurse visit

## 2018-11-28 NOTE — Patient Instructions (Addendum)
You have been referred for an ear evaluation to: [facility], [address], [place], [place] [address]  Phone: [phone] Fax# [phone]     Hearing Loss  Hearing loss is a partial or total loss of the ability to hear. This can be temporary or permanent, and it can happen in one or both ears. Hearing loss may be referred to as deafness. Medical care is necessary to treat hearing loss properly and to prevent the condition from getting worse. Your hearing may partially or completely come back, depending on what caused your hearing loss and how severe it is. In some cases, hearing loss is permanent. What are the causes? Common causes of hearing loss include:  Too much wax in the ear canal.  Infection of the ear canal or middle ear.  Fluid in the middle ear.  Injury to the ear or surrounding area.  An object stuck in the ear.  Prolonged exposure to loud sounds, such as music. Less common causes of hearing loss include:  Tumors in the ear.  Viral or bacterial infections, such as meningitis.  A hole in the eardrum (perforated eardrum).  Problems with the hearing nerve that sends signals between the brain and the ear.  Certain medicines. What are the signs or symptoms? Symptoms of this condition may include:  Difficulty telling the difference between sounds.  Difficulty following a conversation when there is background noise.  Lack of response to sounds in your environment. This may be most noticeable when you do not respond to startling sounds.  Needing to turn up the volume on the television, radio, etc.  Ringing in the ears.  Dizziness.  Pain in the ears. How is this diagnosed? This condition is diagnosed based on a physical exam and a hearing test (audiometry). The audiometry test will be performed by a hearing specialist (audiologist). You may also be referred to an ear, nose, and throat (ENT) specialist (otolaryngologist). How is  this treated? Treatment for recent onset of hearing loss may include:  Ear wax removal.  Being prescribed medicines to prevent infection (antibiotics).  Being prescribed medicines to reduce inflammation (corticosteroids). Follow these instructions at home:  If you were prescribed an antibiotic medicine, take it as told by your health care provider. Do not stop taking the antibiotic even if you start to feel better.  Take over-the-counter and prescription medicines only as told by your health care provider.  Avoid loud noises.  Return to your normal activities as told by your health care provider. Ask your health care provider what activities are safe for you.  Keep all follow-up visits as told by your health care provider. This is important. Contact a health care provider if:  You feel dizzy.  You develop new symptoms.  You vomit or feel nauseous.  You have a fever. Get help right away if:  You develop sudden changes in your vision.  You have severe ear pain.  You have new or increased weakness.  You have a severe headache. This information is not intended to replace advice given to you by your health care provider. Make sure you discuss any questions you have with your health care provider. Document Released: 08/27/2005 Document Revised: 02/02/2016 Document Reviewed: 01/12/2015 Elsevier Interactive Patient Education  2019 [employer].

## 2019-02-11 NOTE — Progress Notes (Signed)
[facility] Note  02/13/2019     CHIEF COMPLAINT Patient presents for Retina Follow Up   HISTORY OF PRESENT ILLNESS: Cody [name] is a 68 y.o. male who presents to the clinic today for:   HPI    Retina Follow Up    Patient presents with  Other.  In both eyes.  This started 3 months ago.  Severity is moderate.  I, the attending physician,  performed the HPI with the patient and updated documentation appropriately.          Comments    Patient here for 3 months retina follow up for ERM OU. Patient states vision doing the same. No eye pain.        Last edited by Bernarda Caffey, MD on 02/13/2019  8:18 AM. (History)    pt states he is doing well, he states he feels like his vision is stable since last visit, pt states he saw Dr. Kathlen Mody for glc eval and had SLT laser on both eyes, pt is unsure when his next appt with Dr. Kathlen Mody is, pt states he is on brimonidine also   Referring physician: Scot Jun, [address] [place], [place] [address]  HISTORICAL INFORMATION:   Selected notes from the MEDICAL RECORD NUMBER Referred by Molli Barrows, FNP for DM exam LEE:  Ocular Hx- PMH-DM (takes metformin), HLD, HTN    CURRENT MEDICATIONS: No current outpatient medications on file. (Ophthalmic Drugs)   No current facility-administered medications for this visit.  (Ophthalmic Drugs)   Current Outpatient Medications (Other)  Medication Sig  . ALPRAZolam (XANAX) 0.5 MG tablet Take 2 tablets approximately 45 minutes prior to the lumbar puncture, take a third tablet if needed.  . Ascorbic Acid (VITAMIN C) 1000 MG tablet Take 1,000 mg by mouth daily.  Marland Kitchen atorvastatin (LIPITOR) 10 MG tablet Take 1 tablet (10 mg total) by mouth daily.  . B Complex-C (SUPER B COMPLEX PO) Take 1 capsule by mouth daily.  . busPIRone (BUSPAR) 15 MG tablet TAKE 1 TABLET (15 MG TOTAL) BY MOUTH 2 (TWO) TIMES DAILY.  . carvedilol (COREG) 3.125 MG tablet Take 1 tablet  (3.125 mg total) by mouth 2 (two) times daily with a meal.  . Cholecalciferol (VITAMIN D3) 25 MCG (1000 UT) CAPS Take 1 capsule by mouth daily.  Marland Kitchen escitalopram (LEXAPRO) 10 MG tablet Take 1 tablet (10 mg total) by mouth at bedtime.  Marland Kitchen lisinopril (PRINIVIL,ZESTRIL) 40 MG tablet Take 1 tablet (40 mg total) by mouth daily.  . metFORMIN (GLUCOPHAGE) 1000 MG tablet Take 1 tablet (1,000 mg total) by mouth daily with breakfast.  . Omega-3 Fatty Acids (FISH OIL) 1200 MG CAPS Take 1 capsule by mouth daily.  Marland Kitchen spironolactone (ALDACTONE) 25 MG tablet Take 1 tablet (25 mg total) by mouth daily.   No current facility-administered medications for this visit.  (Other)      REVIEW OF SYSTEMS: ROS    Positive for: Endocrine, Eyes   Negative for: Constitutional, Gastrointestinal, Neurological, Skin, Genitourinary, Musculoskeletal, HENT, Cardiovascular, Respiratory, Psychiatric, Allergic/Imm, Heme/Lymph   Last edited by Theodore Demark on 02/13/2019  8:05 AM. (History)       ALLERGIES No Known Allergies  PAST MEDICAL HISTORY Past Medical History:  Diagnosis Date  . Balance problem   . Diabetes mellitus without complication ([place])   . Hyperlipidemia   . Hypertension   . Numbness    Past Surgical History:  Procedure Laterality Date  . CYST  REMOVAL NECK     States he was young  . FRACTURE SURGERY     Left foot   . GANGLION CYST EXCISION Left   . TONSILLECTOMY      FAMILY HISTORY Family History  Problem Relation Age of Onset  . Diabetes Brother   . Hypertension Brother   . Obesity Mother   . Heart failure Father     SOCIAL HISTORY Social History   Tobacco Use  . Smoking status: Never Smoker  . Smokeless tobacco: Current User    Types: Chew  Substance Use Topics  . Alcohol use: Not Currently  . Drug use: Never         OPHTHALMIC EXAM:  Base Eye Exam    Visual Acuity (Snellen - Linear)      Right Left   Dist [place] 20/50 +2 20/30 -2   Dist ph [place] NI 20/20       Tonometry  (Tonopen, 8:03 AM)      Right Left   Pressure 17 21       Pupils      Dark Light Shape React APD   Right 4 3 Round Brisk None   Left 4 3 Round Brisk None       Visual Fields (Counting fingers)      Left Right    Full Full       Extraocular Movement      Right Left    Full, Ortho Full, Ortho       Neuro/Psych    Oriented x3:  Yes   Mood/Affect:  Normal       Dilation    Both eyes:  1.0% Mydriacyl, 2.5% Phenylephrine @ 8:03 AM        Slit Lamp and Fundus Exam    Slit Lamp Exam      Right Left   Lids/Lashes Dermatochalasis - upper lid, mild Meibomian gland dysfunction Dermatochalasis - upper lid, mild Meibomian gland dysfunction   Conjunctiva/Sclera White and quiet White and quiet   Cornea Arcus, 1+ Punctate epithelial erosions, mild endo pigment, Krukenberg's spindle Arcus, 1+ Punctate epithelial erosions, mild EBMD, Krukenberg's spindle   Anterior Chamber Deep and quiet Deep and quiet   Iris Round and moderately dilated, No NVI Round and dilated, No NVI   Lens 2-3+ Nuclear sclerosis with early brunescence, 2+ Cortical cataract 2-3+ Nuclear sclerosis with early brunescence, 2+ Cortical cataract   Vitreous Vitreous syneresis, Posterior vitreous detachment Vitreous syneresis       Fundus Exam      Right Left   Disc Pink and Sharp Pink and Sharp   C/D Ratio 0.1 0.2   Macula Flat, blunted foveal reflex, nasal Epiretinal membrane with striae  flat, blunted foveal reflex, Retinal pigment epithelial mottling, No heme or edema   Vessels Vascular attenuation, mild Tortuousity mild Vascular attenuation   Periphery Attached, no heme Attached, no heme          IMAGING AND PROCEDURES  Imaging and Procedures for @TODAY @  OCT, Retina - OU - Both Eyes       Right Eye Quality was good. Central Foveal Thickness: 348. Progression has been stable. Findings include abnormal foveal contour, no IRF, no SRF, epiretinal membrane, macular pucker (ERM; mild interval increase in  macular pucker ).   Left Eye Quality was good. Central Foveal Thickness: 308. Progression has been stable. Findings include normal foveal contour, no SRF, no IRF.   Notes *Images captured and stored on drive  Diagnosis /  Impression:  OD: nasal ERM with macular pucker -- mild interval increase in pucker OS: NFP, no IRF, SRF  Clinical management:  See below  Abbreviations: NFP - Normal foveal profile. CME - cystoid macular edema. PED - pigment epithelial detachment. IRF - intraretinal fluid. SRF - subretinal fluid. EZ - ellipsoid zone. ERM - epiretinal membrane. ORA - outer retinal atrophy. ORT - outer retinal tubulation. SRHM - subretinal hyper-reflective material                 ASSESSMENT/PLAN:    ICD-10-CM   1. Diabetes mellitus type 2 without retinopathy ([place]) E11.9   2. Epiretinal membrane (ERM) of right eye H35.371   3. Retinal edema H35.81 OCT, Retina - OU - Both Eyes  4. Essential hypertension I10   5. Hypertensive retinopathy of both eyes H35.033   6. 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 Combined forms of age-related cataract of both eyes H25.813     1. Diabetes mellitus, type 2 without retinopathy  - The incidence, risk factors for progression, natural history and treatment options for diabetic retinopathy  were discussed with patient.    - The need for close monitoring of blood glucose, blood pressure, and serum lipids, avoiding cigarette or any type of tobacco, and the need for long term follow up was also discussed with patient.  - f/u in 1 year, sooner prn  2,3. Epiretinal membrane with macular pucker OD   - nasal ERM with mild interval progression of pucker  - remains asymptomatic, no metamorphopsia  - BCVA: 20/50 today from 20/25, but subjectively stable  - no indication for surgery at this time  - monitor for now  - f/u 3-4 mos  4,5. Hypertensive retinopathy OU  - discussed importance of tight BP control  - monitor  6. 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 Kathlen Mody for glaucoma eval and management - seen 03.12.20  - per pt, underwent SLT laser OU w/ Kathlen Mody  - IOP 17,21  - cont management per Kathlen Mody   7. Mixed form age related cataract  - The symptoms of cataract, surgical options, and treatments and risks were discussed with patient.  - discussed diagnosis and progression  - not yet visually significant  - under the expert care of Dr. Kathlen Mody   Ophthalmic Meds Ordered this visit:  No orders of the defined types were placed in this encounter.      Return for f/u 3-4 months, ERM OD, DFE, OCT.  There are no Patient Instructions on file for this visit.   Explained the diagnoses, plan, and follow up with the patient and they expressed understanding.  Patient expressed understanding of the importance of proper follow up care.   This document serves as a record of services personally performed by Gardiner Sleeper, MD, PhD. It was created on their behalf by Ernest Mallick, OA, an ophthalmic assistant. The creation of this record is the provider's dictation and/or activities during the visit.    Electronically signed by: Ernest Mallick, OA  06.03.2020 8:44 AM     Gardiner Sleeper, M.D., Ph.D. Diseases & Surgery of the Retina and Vitreous [facility]  I have reviewed the above documentation for accuracy and completeness, and I agree with the above. Gardiner Sleeper, M.D., Ph.D. 02/13/19 8:51 AM  Abbreviations: M myopia (nearsighted); A astigmatism; H hyperopia (farsighted); P presbyopia; Mrx spectacle prescription;  CTL contact lenses; OD right eye; OS left eye; OU both eyes  XT exotropia; ET esotropia; PEK punctate epithelial keratitis; PEE punctate epithelial erosions; DES dry eye syndrome; MGD meibomian gland dysfunction; ATs  artificial tears; PFAT's preservative free artificial tears; [place] nuclear sclerotic cataract; PSC posterior subcapsular cataract; ERM epi-retinal membrane; PVD posterior vitreous detachment; RD retinal detachment; DM diabetes mellitus; DR diabetic retinopathy; NPDR non-proliferative diabetic retinopathy; PDR proliferative diabetic retinopathy; CSME clinically significant macular edema; DME diabetic macular edema; dbh dot blot hemorrhages; CWS cotton wool spot; POAG primary open angle glaucoma; C/D cup-to-disc ratio; HVF humphrey visual field; GVF goldmann visual field; OCT optical coherence tomography; IOP intraocular pressure; BRVO Branch retinal vein occlusion; CRVO central retinal vein occlusion; CRAO central retinal artery occlusion; BRAO branch retinal artery occlusion; RT retinal tear; SB scleral buckle; PPV pars plana vitrectomy; VH Vitreous hemorrhage; PRP panretinal laser photocoagulation; IVK intravitreal kenalog; VMT vitreomacular traction; MH Macular hole;  NVD neovascularization of the disc; NVE neovascularization elsewhere; AREDS age related eye disease study; ARMD age related macular degeneration; POAG primary open angle glaucoma; EBMD epithelial/anterior basement membrane dystrophy; ACIOL anterior chamber intraocular lens; IOL intraocular lens; PCIOL posterior chamber intraocular lens; Phaco/IOL phacoemulsification with intraocular lens placement; [place] photorefractive keratectomy; LASIK laser assisted in situ keratomileusis; HTN hypertension; DM diabetes mellitus; COPD chronic obstructive pulmonary disease

## 2019-02-13 ENCOUNTER — Other Ambulatory Visit: Payer: Self-pay

## 2019-02-13 ENCOUNTER — Encounter (INDEPENDENT_AMBULATORY_CARE_PROVIDER_SITE_OTHER): Payer: Self-pay | Admitting: Ophthalmology

## 2019-02-13 ENCOUNTER — Ambulatory Visit (INDEPENDENT_AMBULATORY_CARE_PROVIDER_SITE_OTHER): Payer: Medicare (Managed Care) | Admitting: Ophthalmology

## 2019-02-13 DIAGNOSIS — H25813 Combined forms of age-related cataract, bilateral: Secondary | ICD-10-CM

## 2019-02-13 DIAGNOSIS — H3581 Retinal edema: Secondary | ICD-10-CM | POA: Diagnosis not present

## 2019-02-13 DIAGNOSIS — I10 Essential (primary) hypertension: Secondary | ICD-10-CM

## 2019-02-13 DIAGNOSIS — E119 Type 2 diabetes mellitus without complications: Secondary | ICD-10-CM

## 2019-02-13 DIAGNOSIS — H35033 Hypertensive retinopathy, bilateral: Secondary | ICD-10-CM

## 2019-02-13 DIAGNOSIS — H35371 Puckering of macula, right eye: Secondary | ICD-10-CM

## 2019-02-13 DIAGNOSIS — H40053 Ocular hypertension, bilateral: Secondary | ICD-10-CM

## 2019-02-16 ENCOUNTER — Telehealth: Payer: Self-pay | Admitting: Neurology

## 2019-02-16 NOTE — Telephone Encounter (Signed)
Due to current COVID 19 pandemic, our office is severely reducing in office visits until further notice, in order to minimize the risk to our patients and healthcare providers.   Called both numbers provided in patient chart regarding patient's 6/11 appt. I LVM for [facility] for patient transportation and gave my direct line and office hours. I also LVM for the patient regarding his appt. I requested he call back to schedule a virtual visit. If patient is unable to do virtual visit an office visit can be offered. I also gave patient my direct line and office hours.

## 2019-02-17 NOTE — Telephone Encounter (Signed)
I called the patient back today and was able to reach him regarding his 6/11 appointment. I informed him that I have tried to contact [employer] twice and have left voicemails. I have not received a call back. I offered the patient a virtual visit but at this time he would just prefer a telephone call. I called [facility] back after I hung up with patient and left a voicemail to let them know that transportation for the patient will not be needed for this visit. I gave my direct office number for reference. Patient understands that he will receive 3 calls, one from RN, one from front office prior to appt, and Dr. Jannifer Franklin will call for appt.  Pt understands that although there may be some limitations with this type of visit, we will take all precautions to reduce any security or privacy concerns.  Pt understands that this will be treated like an in office visit and we will file with pt's insurance, and there may be a patient responsible charge related to this service.

## 2019-02-18 NOTE — Telephone Encounter (Signed)
I attempted to reach the pt to complete pre charting for 02/19/19 telephone visit.  Pt was not available.

## 2019-02-19 ENCOUNTER — Encounter: Payer: Self-pay | Admitting: Neurology

## 2019-02-19 ENCOUNTER — Other Ambulatory Visit: Payer: Self-pay

## 2019-02-19 ENCOUNTER — Ambulatory Visit (INDEPENDENT_AMBULATORY_CARE_PROVIDER_SITE_OTHER): Payer: Medicare (Managed Care) | Admitting: Neurology

## 2019-02-19 VITALS — BP 124/67 | HR 85 | Temp 96.8°F | Ht 66.0 in | Wt 147.0 lb

## 2019-02-19 DIAGNOSIS — R2689 Other abnormalities of gait and mobility: Secondary | ICD-10-CM

## 2019-02-19 DIAGNOSIS — R413 Other amnesia: Secondary | ICD-10-CM

## 2019-02-19 DIAGNOSIS — E1142 Type 2 diabetes mellitus with diabetic polyneuropathy: Secondary | ICD-10-CM | POA: Diagnosis not present

## 2019-02-19 DIAGNOSIS — E1140 Type 2 diabetes mellitus with diabetic neuropathy, unspecified: Secondary | ICD-10-CM

## 2019-02-19 HISTORY — DX: Type 2 diabetes mellitus with diabetic neuropathy, unspecified: E11.40

## 2019-02-19 NOTE — Progress Notes (Signed)
Reason for visit: Gait disorder, peripheral neuropathy, memory disorder  Cody [name] is an 68 y.o. male  History of present illness:  [name] is a 68 year old right-handed white male with a history of a diabetic peripheral neuropathy that has demyelinating features.  Work-up for CIDP was negative.  The patient also has a memory disorder, he currently does not operate a motor vehicle.  He uses a walker for ambulation, but he has had 2 falls since last seen with a walker.  He indicates that his left leg may feel as if it may give out at times.  He has cramping in the toes at times.  He denies any burning dysesthesias in the feet, he is able to sleep fairly well.  He has the perception of numbness across the ankles on both sides.  He denies any numbness in the hands.  He has undergone MRI of the brain that showed minimal white matter changes.  He currently is with [facility].  He believes that shortly he may be able to access therapy through [facility].  He returns this office for an evaluation.  Past Medical History:  Diagnosis Date  . Balance problem   . Diabetes mellitus without complication ([place])   . Diabetic neuropathy ([place]) 02/19/2019  . Hyperlipidemia   . Hypertension   . Numbness     Past Surgical History:  Procedure Laterality Date  . CYST REMOVAL NECK     States he was young  . FRACTURE SURGERY     Left foot   . GANGLION CYST EXCISION Left   . TONSILLECTOMY      Family History  Problem Relation Age of Onset  . Diabetes Brother   . Hypertension Brother   . Obesity Mother   . Heart failure Father     Social history:  reports that he has never smoked. His smokeless tobacco use includes chew. He reports previous alcohol use. He reports that he does not use drugs.   No Known Allergies  Medications:  Prior to Admission medications   Medication Sig Start Date End Date Taking? Authorizing Provider  Ascorbic Acid (VITAMIN C) 1000 MG tablet Take 1,000 mg by mouth daily.   Yes  [provider]  atorvastatin (LIPITOR) 10 MG tablet Take 1 tablet (10 mg total) by mouth daily. 10/17/18  Yes Scot Jun, FNP  B Complex-C (SUPER B COMPLEX PO) Take 1 capsule by mouth daily.   Yes [provider]  busPIRone (BUSPAR) 15 MG tablet TAKE 1 TABLET (15 MG TOTAL) BY MOUTH 2 (TWO) TIMES DAILY. 11/20/18  Yes Scot Jun, FNP  carvedilol (COREG) 3.125 MG tablet Take 1 tablet (3.125 mg total) by mouth 2 (two) times daily with a meal. 10/17/18  Yes Scot Jun, FNP  Cholecalciferol (VITAMIN D3) 25 MCG (1000 UT) CAPS Take 1 capsule by mouth daily.   Yes [provider]  escitalopram (LEXAPRO) 10 MG tablet Take 1 tablet (10 mg total) by mouth at bedtime. 10/17/18  Yes Scot Jun, FNP  lisinopril (PRINIVIL,ZESTRIL) 40 MG tablet Take 1 tablet (40 mg total) by mouth daily. 10/17/18  Yes Scot Jun, FNP  metFORMIN (GLUCOPHAGE) 1000 MG tablet Take 1 tablet (1,000 mg total) by mouth daily with breakfast. 10/17/18  Yes Scot Jun, FNP  Omega-3 Fatty Acids (FISH OIL) 1200 MG CAPS Take 1 capsule by mouth daily.   Yes [provider]  spironolactone (ALDACTONE) 25 MG tablet Take 1 tablet (25 mg total) by  mouth daily. 10/17/18  Yes Scot Jun, FNP    ROS:  Out of a complete 14 system review of symptoms, the patient complains only of the following symptoms, and all other reviewed systems are negative.  Toe cramping Numbness Walking difficulty Memory disturbance  Blood pressure 124/67, pulse 85, temperature (!) 96.8 F (36 C), temperature source Oral, height 5\' 6"  (1.676 m), weight 147 lb (66.7 kg).  Physical Exam  General: The patient is alert and cooperative at the time of the examination.  Skin: No significant peripheral edema is noted.   Neurologic Exam  Mental status: The patient is alert and oriented x 3 at the time of the examination.   Cranial nerves: Facial symmetry is present. Speech is normal, no  aphasia or dysarthria is noted, but speech is slow and deliberate, occasionally with a stuttering quality. Extraocular movements are full. Visual fields are full.  Motor: The patient has good strength in all 4 extremities, with exception of trace weakness of the intrinsic muscles of the hands bilaterally and with hip flexion bilaterally.  Sensory examination: Soft touch sensation is symmetric on the face, arms, and legs.  Coordination: The patient has good finger-nose-finger and heel-to-shin bilaterally.  Gait and station: The patient has a wide-based gait, the patient ambulates with a walker.  Tandem gait was not attempted.  Romberg is negative but is unsteady.  No drift is seen.  Reflexes: Deep tendon reflexes are symmetric, ankle jerk reflexes are depressed, knee jerk reflexes are present bilaterally.   Assessment/Plan:  1.  Diabetic peripheral neuropathy  2.  Gait disturbance  3.  Memory disturbance  I have recommended getting into physical therapy for his walking problems but the patient believes that he can access therapy through [facility].  I have offered medications for memory such as Aricept, but the patient currently does not wish to start a new medication.  He will contact our office if he decides otherwise.  He will follow-up in 6 months.  Jill Alexanders MD 02/19/2019 9:45 AM  [facility] [address] [place], [place] [address]  Phone [phone] Fax [phone]

## 2019-05-19 ENCOUNTER — Encounter (INDEPENDENT_AMBULATORY_CARE_PROVIDER_SITE_OTHER): Payer: Medicare (Managed Care) | Admitting: Ophthalmology

## 2019-06-11 ENCOUNTER — Ambulatory Visit (INDEPENDENT_AMBULATORY_CARE_PROVIDER_SITE_OTHER): Payer: Medicare Other | Admitting: Otolaryngology

## 2019-07-24 ENCOUNTER — Other Ambulatory Visit: Payer: Self-pay | Admitting: Internal Medicine

## 2019-07-24 ENCOUNTER — Other Ambulatory Visit (HOSPITAL_COMMUNITY): Payer: Self-pay | Admitting: Internal Medicine

## 2019-07-24 DIAGNOSIS — R1310 Dysphagia, unspecified: Secondary | ICD-10-CM

## 2019-08-03 ENCOUNTER — Ambulatory Visit (HOSPITAL_COMMUNITY)
Admission: RE | Admit: 2019-08-03 | Discharge: 2019-08-03 | Disposition: A | Discharge status: Home / Self Care | Payer: Medicare (Managed Care) | Source: Ambulatory Visit | Attending: Internal Medicine | Admitting: Internal Medicine

## 2019-08-03 ENCOUNTER — Other Ambulatory Visit: Payer: Self-pay

## 2019-08-03 DIAGNOSIS — R1310 Dysphagia, unspecified: Secondary | ICD-10-CM | POA: Diagnosis present

## 2019-08-24 ENCOUNTER — Ambulatory Visit: Payer: Medicare (Managed Care) | Admitting: Neurology

## 2019-09-01 ENCOUNTER — Encounter: Payer: Self-pay | Admitting: Gastroenterology

## 2019-10-08 ENCOUNTER — Ambulatory Visit (INDEPENDENT_AMBULATORY_CARE_PROVIDER_SITE_OTHER): Payer: Medicare (Managed Care) | Admitting: Gastroenterology

## 2019-10-08 ENCOUNTER — Encounter: Payer: Self-pay | Admitting: Gastroenterology

## 2019-10-08 VITALS — BP 142/80 | HR 83 | Temp 96.7°F | Ht 66.0 in | Wt 158.0 lb

## 2019-10-08 DIAGNOSIS — Z01818 Encounter for other preprocedural examination: Secondary | ICD-10-CM | POA: Diagnosis not present

## 2019-10-08 DIAGNOSIS — K222 Esophageal obstruction: Secondary | ICD-10-CM | POA: Diagnosis not present

## 2019-10-08 DIAGNOSIS — R1310 Dysphagia, unspecified: Secondary | ICD-10-CM

## 2019-10-08 NOTE — Progress Notes (Signed)
HPI :  69 year old male, history of diabetes, diabetic neuropathy, hypertension, referred here by Molli Barrows, FNP for dysphagia and abnormal barium swallow.  He states for roughly 3 months he was having significant dysphagia to solids.  This would occur in his mid chest, usually would resolve on its own or drinking something.  He states he actually had some dysphagia to liquids during this time as well.  He denies any reflux symptoms of pyrosis or regurgitation during this time.  No nausea or vomiting, no abdominal pains.  He has regular bowel movements, no blood in his stools.  Denies any weight loss, has actually gained some weight recently.  Denies any family history of esophageal cancer, colon cancer, or gastric cancer.  He had a barium swallow performed on November 23 for the symptoms.  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  He was also found to have a moderate sized hiatal hernia.  He states for the past few weeks he has not had much dysphagia anymore.  He has not been eating chicken or meat for the most part, eats a lot of cereal and soft foods.  He thinks he may have had an EGD several years ago remotely, does not recall where or when.  He thinks his last colonoscopy was 8 years ago, done in [place], he is states it was normal and he had no polyps.  We do not have any records of that exam on file.  He generally feels well otherwise without complaints.  No cardiopulmonary symptoms that bother him.  He ambulates with a walker due to balance problems in light of his neuropathy.   Barium swallow 08/03/19 - IMPRESSION: 1. Findings consistent with distal esophageal stricture. A 13 mm barium tablet lodged in the distal esophagus just above the GE junction/hiatal hernia. Recommend upper endoscopy for further evaluation. 2. Patient had episode of regurgitation related to the  barium tablet blockage 3. Moderate hiatal hernia    Past Medical History:  Diagnosis Date  . Balance problem   . Diabetes mellitus without complication ([place])   . Diabetic neuropathy ([place]) 02/19/2019  . Hyperlipidemia   . Hypertension   . Numbness      Past Surgical History:  Procedure Laterality Date  . CYST REMOVAL NECK     States he was young  . FRACTURE SURGERY     Left foot   . GANGLION CYST EXCISION Left   . TONSILLECTOMY     Family History  Problem Relation Age of Onset  . Diabetes Brother   . Hypertension Brother   . Obesity Mother   . Heart failure Father   . Colon cancer Neg Hx   . Esophageal cancer Neg Hx   . Pancreatic cancer Neg Hx   . Stomach cancer Neg Hx    Social History   Tobacco Use  . Smoking status: Never Smoker  . Smokeless tobacco: Current User    Types: Chew  Substance Use Topics  . Alcohol use: Not Currently  . Drug use: Never   Current Outpatient Medications  Medication Sig Dispense Refill  . atorvastatin (LIPITOR) 10 MG tablet Take 1 tablet (10 mg total) by mouth daily. 30 tablet 2  . B Complex-C (SUPER B COMPLEX PO) Take 1 capsule by mouth daily.    . busPIRone (BUSPAR) 15 MG tablet TAKE 1 TABLET (15 MG TOTAL) BY MOUTH 2 (  TWO) TIMES DAILY. 60 tablet 6  . carvedilol (COREG) 3.125 MG tablet Take 1 tablet (3.125 mg total) by mouth 2 (two) times daily with a meal. 60 tablet 2  . escitalopram (LEXAPRO) 10 MG tablet Take 1 tablet (10 mg total) by mouth at bedtime. 60 tablet 0  . lisinopril (PRINIVIL,ZESTRIL) 40 MG tablet Take 1 tablet (40 mg total) by mouth daily. 30 tablet 2  . metFORMIN (GLUCOPHAGE) 1000 MG tablet Take 1 tablet (1,000 mg total) by mouth daily with breakfast. 60 tablet 2  . spironolactone (ALDACTONE) 25 MG tablet Take 1 tablet (25 mg total) by mouth daily. 90 tablet 1   No current facility-administered medications for this visit.   No Known Allergies   Review of Systems: All systems reviewed and negative except  where noted in HPI.   Lab Results  Component Value Date   WBC 6.8 10/16/2018   HGB 12.8 (L) 10/16/2018   HCT 38.1 10/16/2018   MCV 99 (H) 10/16/2018   PLT 211 10/16/2018    Lab Results  Component Value Date   CREATININE 1.02 10/03/2018   BUN 24 10/03/2018   NA 143 10/03/2018   K 5.0 10/03/2018   CL 103 10/03/2018   CO2 21 10/03/2018    Lab Results  Component Value Date   ALT 26 10/03/2018   AST 22 10/03/2018   ALKPHOS 79 10/03/2018   BILITOT 0.5 10/03/2018      Physical Exam: BP (!) 142/80 (BP Location: Left Arm, Patient Position: Sitting, Cuff Size: Normal)   Pulse 83   Temp (!) 96.7 F (35.9 C)   Ht 5\' 6"  (1.676 m)   Wt 158 lb (71.7 kg)   SpO2 98%   BMI 25.50 kg/m  Constitutional: Pleasant, male in no acute distress, using walker to ambulate HEENT: Normocephalic and atraumatic. Conjunctivae are normal. No scleral icterus. Neck supple.  Cardiovascular: Normal rate, regular rhythm.  Pulmonary/chest: Effort normal and breath sounds normal. No wheezing, rales or rhonchi. Abdominal: Soft, nondistended, nontender. There are no masses palpable.  Extremities: no edema Lymphadenopathy: No cervical adenopathy noted. Neurological: Alert and oriented to person place and time. Skin: Skin is warm and dry. No rashes noted. Psychiatric: Normal mood and affect. Behavior is normal.   ASSESSMENT AND PLAN: 69 year old male here for new patient assessment of the following:  Dysphagia / esophageal stricture - as above, few months worth of dysphagia which led to barium swallow showing GEJ stricture just proximal to the hiatal hernia to his symptoms.  He had frequent severe symptoms for several weeks, however more recently symptoms have been milder, unclear if this is due to diet change as he is not eating chicken or beef, mostly only soft foods.  I discussed differential diagnosis for the stricture with him.  This is likely a benign peptic stricture although endoscopy is  recommended to ensure this, make sure no malignant changes.  I discussed what EGD is with him and dilation.  We discussed risks of EGD and anesthesia and he wanted to proceed with further evaluation and dilation of the stricture.  Further recommendations pending the results and his course.  He should continue to avoid eating chicken and beef to prevent impaction in the interim if this reproduces his dysphagia.  Moravia Cellar, MD [facility]  CC: Scot Jun, FNP

## 2019-10-08 NOTE — Patient Instructions (Signed)
If you are age 70 or older, your body mass index should be between 23-30. Your Body mass index is 25.5 kg/m. If this is out of the aforementioned range listed, please consider follow up with your Primary Care Provider.  If you are age 12 or younger, your body mass index should be between 19-25. Your Body mass index is 25.5 kg/m. If this is out of the aformentioned range listed, please consider follow up with your Primary Care Provider.   You have been scheduled for an endoscopy. Please follow written instructions given to you at your visit today. If you use inhalers (even only as needed), please bring them with you on the day of your procedure.   Thank you for entrusting me with your care and for choosing [facility], Dr. [place] Cellar

## 2019-10-12 ENCOUNTER — Telehealth: Payer: Self-pay

## 2019-10-12 NOTE — Telephone Encounter (Signed)
Amy hazelwood sent a staff message to me letting me know she spoke to [place] at [facility]. ( and they are the ones who provide transportation and a care giver to come in,  For the  patient's procedures)They did not know about his COVID appt. On 10/09/19 or his EGD on 10/13/19. Having to reschedule since he did not get his COVID test. I have left a message for Ria Comment to call me back to reschedule both. Also called patient to let him know we can not do the EGD tomorrow, that we will call him back as soon as I can get it rescheduled with Ria Comment

## 2019-10-12 NOTE — Telephone Encounter (Signed)
Okay thanks for letting me know, that is unfortunate. Yes would reschedule him once can be coordinated with his transportation. Thanks

## 2019-10-12 NOTE — Telephone Encounter (Signed)
-----   Message from Darden Dates sent at 10/12/2019  9:43 AM EST ----- Regarding: EGD Tomorrow Good morning Sherlynn Stalls, I called [facility] this morning to get this patient's auth for EGD for tomorrow, and found out from [place] there that they did not have him down for tomorrow.  (they set up all arrangements for transportation and caregivers to be present)  Also, he was down for a Covid test on Friday and did not show.  Ria Comment said that was because they did not have him down for Covid testing appt either.   I know he will need to be cancelled and appts will need to be rescheduled and [facility] contacted with the information. I'm sorry to give this to you, I would give to Ursa that was in clinica to set up, but [name] is off today.  Beings the procedure is tomorrow, I knew this was an urgent matter. Thanks, Amy

## 2019-10-13 ENCOUNTER — Telehealth: Payer: Self-pay

## 2019-10-13 ENCOUNTER — Encounter: Payer: Medicare (Managed Care) | Admitting: Gastroenterology

## 2019-10-13 NOTE — Telephone Encounter (Signed)
-----   Message from Darden Dates sent at 10/12/2019  9:43 AM EST ----- Regarding: EGD Tomorrow Good morning Sherlynn Stalls, I called [facility] this morning to get this patient's auth for EGD for tomorrow, and found out from [place] there that they did not have him down for tomorrow.  (they set up all arrangements for transportation and caregivers to be present)  Also, he was down for a Covid test on Friday and did not show.  Ria Comment said that was because they did not have him down for Covid testing appt either.   I know he will need to be cancelled and appts will need to be rescheduled and [facility] contacted with the information. I'm sorry to give this to you, I would give to [place] that was in clinica to set up, but [name] is off today.  Beings the procedure is tomorrow, I knew this was an urgent matter. Thanks, Amy

## 2019-10-13 NOTE — Telephone Encounter (Signed)
Spoke to [place] with [facility] and rescheduled patient's EGD for 10/30/19 at 10:00am. Patient to arrive at 9:00am Ria Comment stated they didn't have transportation and a care giver available until after 10/26/19 and it couldn't be a Wed.). COVID testing on 10/28/19 at 10:00am at [id number]. And Ria Comment given that information also. Called patient and gave the above information and went over Instructions for EGD. I also mailed the new instructions.

## 2019-10-30 ENCOUNTER — Encounter: Payer: Medicare (Managed Care) | Admitting: Gastroenterology

## 2019-11-12 ENCOUNTER — Ambulatory Visit (INDEPENDENT_AMBULATORY_CARE_PROVIDER_SITE_OTHER): Payer: Medicare (Managed Care)

## 2019-11-12 ENCOUNTER — Other Ambulatory Visit: Payer: Self-pay | Admitting: Gastroenterology

## 2019-11-12 ENCOUNTER — Other Ambulatory Visit: Payer: Self-pay

## 2019-11-12 DIAGNOSIS — Z1159 Encounter for screening for other viral diseases: Secondary | ICD-10-CM

## 2019-11-13 LAB — SARS CORONAVIRUS 2 (TAT 6-24 HRS): SARS Coronavirus 2: NEGATIVE

## 2019-11-16 ENCOUNTER — Ambulatory Visit (AMBULATORY_SURGERY_CENTER): Payer: Medicare (Managed Care) | Admitting: Gastroenterology

## 2019-11-16 ENCOUNTER — Encounter: Payer: Self-pay | Admitting: Gastroenterology

## 2019-11-16 ENCOUNTER — Other Ambulatory Visit: Payer: Self-pay

## 2019-11-16 VITALS — BP 118/78 | HR 83 | Temp 95.3°F | Resp 19 | Ht 66.0 in | Wt 158.0 lb

## 2019-11-16 DIAGNOSIS — K2950 Unspecified chronic gastritis without bleeding: Secondary | ICD-10-CM

## 2019-11-16 DIAGNOSIS — R1310 Dysphagia, unspecified: Secondary | ICD-10-CM | POA: Diagnosis not present

## 2019-11-16 DIAGNOSIS — K449 Diaphragmatic hernia without obstruction or gangrene: Secondary | ICD-10-CM

## 2019-11-16 DIAGNOSIS — K222 Esophageal obstruction: Secondary | ICD-10-CM

## 2019-11-16 DIAGNOSIS — K269 Duodenal ulcer, unspecified as acute or chronic, without hemorrhage or perforation: Secondary | ICD-10-CM | POA: Diagnosis not present

## 2019-11-16 MED ORDER — OMEPRAZOLE 40 MG PO CPDR: 40.0000 mg | DELAYED_RELEASE_CAPSULE | Freq: Two times a day (BID) | ORAL | 3 refills | Status: DC

## 2019-11-16 MED ORDER — SODIUM CHLORIDE 0.9 % IV SOLN: 500.0000 mL | Freq: Once | INTRAVENOUS | Status: DC

## 2019-11-16 NOTE — Progress Notes (Signed)
Temperature taken by J.B., VS taken by C.W. 

## 2019-11-16 NOTE — Progress Notes (Signed)
Called to room to assist during endoscopic procedure.  Patient ID and intended procedure confirmed with present staff. Received instructions for my participation in the procedure from the performing physician.  

## 2019-11-16 NOTE — Op Note (Signed)
[facility] Patient Name: Cody [name] Procedure Date: 11/16/2019 10:24 AM MRN: [id number] Endoscopist: Remo Lipps P. Cody [name] , MD Age: 69 Referring MD:  Date of Birth: 1950-11-15 Gender: Male Account #: [id number] Procedure:                Upper GI endoscopy Indications:              Dysphagia - GEJ stricture on barium study Medicines:                Monitored Anesthesia Care Procedure:                Pre-Anesthesia Assessment:                           - Prior to the procedure, a History and Physical                            was performed, and patient medications and                            allergies were reviewed. The patient's tolerance of                            previous anesthesia was also reviewed. The risks                            and benefits of the procedure and the sedation                            options and risks were discussed with the patient.                            All questions were answered, and informed consent                            was obtained. Prior Anticoagulants: The patient has                            taken no previous anticoagulant or antiplatelet                            agents. ASA Grade Assessment: III - A patient with                            severe systemic disease. After reviewing the risks                            and benefits, the patient was deemed in                            satisfactory condition to undergo the procedure.                           After obtaining informed consent, the endoscope was  passed under direct vision. Throughout the                            procedure, the patient's blood pressure, pulse, and                            oxygen saturations were monitored continuously. The                            Endoscope was introduced through the mouth, and                            advanced to the second part of duodenum. The upper                            GI  endoscopy was accomplished without difficulty.                            The patient tolerated the procedure well. Scope In: Scope Out: Findings:                 Esophagogastric landmarks were identified: the                            Z-line was found at 36 cm, the gastroesophageal                            junction was found at 36 cm and the upper extent of                            the gastric folds was found at 40 cm from the                            incisors.                           A 4 cm hiatal hernia was present.                           One intrinsic moderate stenosis was found 36 cm                            from the incisors. This stenosis measured less than                            one cm (in length). The stenosis was traversed with                            the endoscope with mild resistance. A TTS dilator                            was passed through the scope. Dilation with a 13 mm  balloon dilator was performed to 13 mm with an                            appropriate mucosal wrent. Additional biopsies were                            taken in other areas of the stricture to open it                            further.                           The exam of the esophagus was otherwise normal.                           Localized nodular / friable mucosal changes were                            found in the cardia, just underneath the stricture                            in the hernia sac. No ulcerations. Suspect this                            could be inflammatory / related to reflux but                            biopsies were taken with a cold forceps for                            histology to rule out dysplasia / malignancy.                           The exam of the stomach was otherwise normal.                           Biopsies were taken with a cold forceps in the                            gastric body, at the incisura and in the  gastric                            antrum for Helicobacter pylori testing.                           Three non-bleeding small (2-72mm) superficial                            duodenal ulcers with no stigmata of bleeding were                            found in the second portion of the duodenum.  Patchy mildly erythematous mucosa was found in the                            duodenal bulb.                           The exam of the duodenum was otherwise normal. Complications:            No immediate complications. Estimated blood loss:                            Minimal. Estimated Blood Loss:     Estimated blood loss was minimal. Impression:               - Esophagogastric landmarks identified.                           - 4 cm hiatal hernia.                           - Moderate esophageal stenosis. Dilated to 79mm                            with good result.                           - Nodular / friable mucosal changes in the cardia                            as outlined, hopefully just due reflux /                            inflammatory changes but biopsies obtained to rule                            out dysplasia / malignancy.                           - Normal stomach otherwise - biopsies taken to rule                            out H pylori                           - Non-bleeding 3 small superficial duodenal ulcers                            with no stigmata of bleeding.                           - Erythematous duodenopathy. Recommendation:           - Patient has a contact number available for                            emergencies. The signs and symptoms of potential  delayed complications were discussed with the                            patient. Return to normal activities tomorrow.                            Written discharge instructions were provided to the                            patient.                           - Resume  previous diet.                           - Continue present medications.                           - Start omeprazole 40mg  twice daily for 2 weeks                            then once daily thereafter                           - Await pathology results.                           - Anticipate repeat EGD in the upcoming weeks for                            reassessment of cardia area and dilation of                            stricture further, pending benign path                           - Avoid NSAIDs Cody [name]. Cody Rosen, MD 11/16/2019 10:54:43 AM This report has been signed electronically.

## 2019-11-16 NOTE — Patient Instructions (Signed)
YOU HAD AN ENDOSCOPIC PROCEDURE TODAY AT THE [facility]:   Refer to the procedure report that was given to you for any specific questions about what was found during the examination.  If the procedure report does not answer your questions, please call your gastroenterologist to clarify.  If you requested that your care partner not be given the details of your procedure findings, then the procedure report has been included in a sealed envelope for you to review at your convenience later.  YOU SHOULD EXPECT: Some feelings of bloating in the abdomen. Passage of more gas than usual.  Walking can help get rid of the air that was put into your GI tract during the procedure and reduce the bloating. If you had a lower endoscopy (such as a colonoscopy or flexible sigmoidoscopy) you may notice spotting of blood in your stool or on the toilet paper. If you underwent a bowel prep for your procedure, you may not have a normal bowel movement for a few days.  Please Note:  You might notice some irritation and congestion in your nose or some drainage.  This is from the oxygen used during your procedure.  There is no need for concern and it should clear up in a day or so.  SYMPTOMS TO REPORT IMMEDIATELY:    Following upper endoscopy (EGD)  Vomiting of blood or coffee ground material  New chest pain or pain under the shoulder blades  Painful or persistently difficult swallowing  New shortness of breath  Fever of 100F or higher  Black, tarry-looking stools  For urgent or emergent issues, a gastroenterologist can be reached at any hour by calling [phone]. Do not use MyChart messaging for urgent concerns.    DIET:  We do recommend a small meal at first, but then you may proceed to your regular diet.  Drink plenty of fluids but you should avoid alcoholic beverages for 24 hours.  ACTIVITY:  You should plan to take it easy for the rest of today and you should NOT DRIVE or use heavy machinery  until tomorrow (because of the sedation medicines used during the test).    FOLLOW UP: Our staff will call the number listed on your records 48-72 hours following your procedure to check on you and address any questions or concerns that you may have regarding the information given to you following your procedure. If we do not reach you, we will leave a message.  We will attempt to reach you two times.  During this call, we will ask if you have developed any symptoms of COVID 19. If you develop any symptoms (ie: fever, flu-like symptoms, shortness of breath, cough etc.) before then, please call [phone].  If you test positive for Covid 19 in the 2 weeks post procedure, please call and report this information to us.    If any biopsies were taken you will be contacted by phone or by letter within the next 1-3 weeks.  Please call us at [phone] if you have not heard about the biopsies in 3 weeks.    SIGNATURES/CONFIDENTIALITY: You and/or your care partner have signed paperwork which will be entered into your electronic medical record.  These signatures attest to the fact that that the information above on your After Visit Summary has been reviewed and is understood.  Full responsibility of the confidentiality of this discharge information lies with you and/or your care-partner. 

## 2019-11-18 ENCOUNTER — Telehealth: Payer: Self-pay | Admitting: *Deleted

## 2019-11-18 NOTE — Telephone Encounter (Signed)
  Follow up Call-  Call back number 11/16/2019  Post procedure Call Back phone  # [phone]  Permission to leave phone message Yes     Patient questions:  Do you have a fever, pain , or abdominal swelling? No. Pain Score  0 *  Have you tolerated food without any problems? Yes.    Have you been able to return to your normal activities? Yes.    Do you have any questions about your discharge instructions: Diet   No. Medications  No. Follow up visit  No.  Do you have questions or concerns about your Care? No.  Actions: * If pain score is 4 or above: No action needed, pain <4.

## 2019-11-19 ENCOUNTER — Telehealth: Payer: Self-pay

## 2019-11-19 NOTE — Telephone Encounter (Signed)
Left message to please call back. °

## 2019-11-20 ENCOUNTER — Telehealth: Payer: Self-pay

## 2019-11-20 NOTE — Telephone Encounter (Signed)
Dr. Jimmye Norman called back and she had already seen the results of patient's EGD (it had been faxed to her), but she was appreciative of the phone call.

## 2019-12-22 ENCOUNTER — Encounter: Payer: Medicare (Managed Care) | Admitting: Gastroenterology

## 2020-02-05 ENCOUNTER — Ambulatory Visit
Admission: RE | Admit: 2020-02-05 | Discharge: 2020-02-05 | Disposition: A | Discharge status: Home / Self Care | Payer: No Typology Code available for payment source | Source: Ambulatory Visit | Attending: Family Medicine | Admitting: Family Medicine

## 2020-02-05 ENCOUNTER — Other Ambulatory Visit: Payer: Self-pay | Admitting: Family Medicine

## 2020-02-05 DIAGNOSIS — M25562 Pain in left knee: Secondary | ICD-10-CM

## 2020-05-02 ENCOUNTER — Ambulatory Visit
Admission: RE | Admit: 2020-05-02 | Discharge: 2020-05-02 | Disposition: A | Discharge status: Home / Self Care | Payer: Medicare (Managed Care) | Source: Ambulatory Visit | Attending: Family Medicine | Admitting: Family Medicine

## 2020-05-02 ENCOUNTER — Other Ambulatory Visit: Payer: Self-pay | Admitting: Family Medicine

## 2020-05-02 DIAGNOSIS — R52 Pain, unspecified: Secondary | ICD-10-CM

## 2021-01-04 IMAGING — DX DG FOOT COMPLETE 3+V*L*
3 series · 3 of 3 positions shown · non-contrast
Comparison: None.

CLINICAL DATA: Left foot numbness and tingling. Rinse of fracture
of the first and second toe.

EXAM:
LEFT FOOT - COMPLETE 3+ VIEW

[foot supine dp]
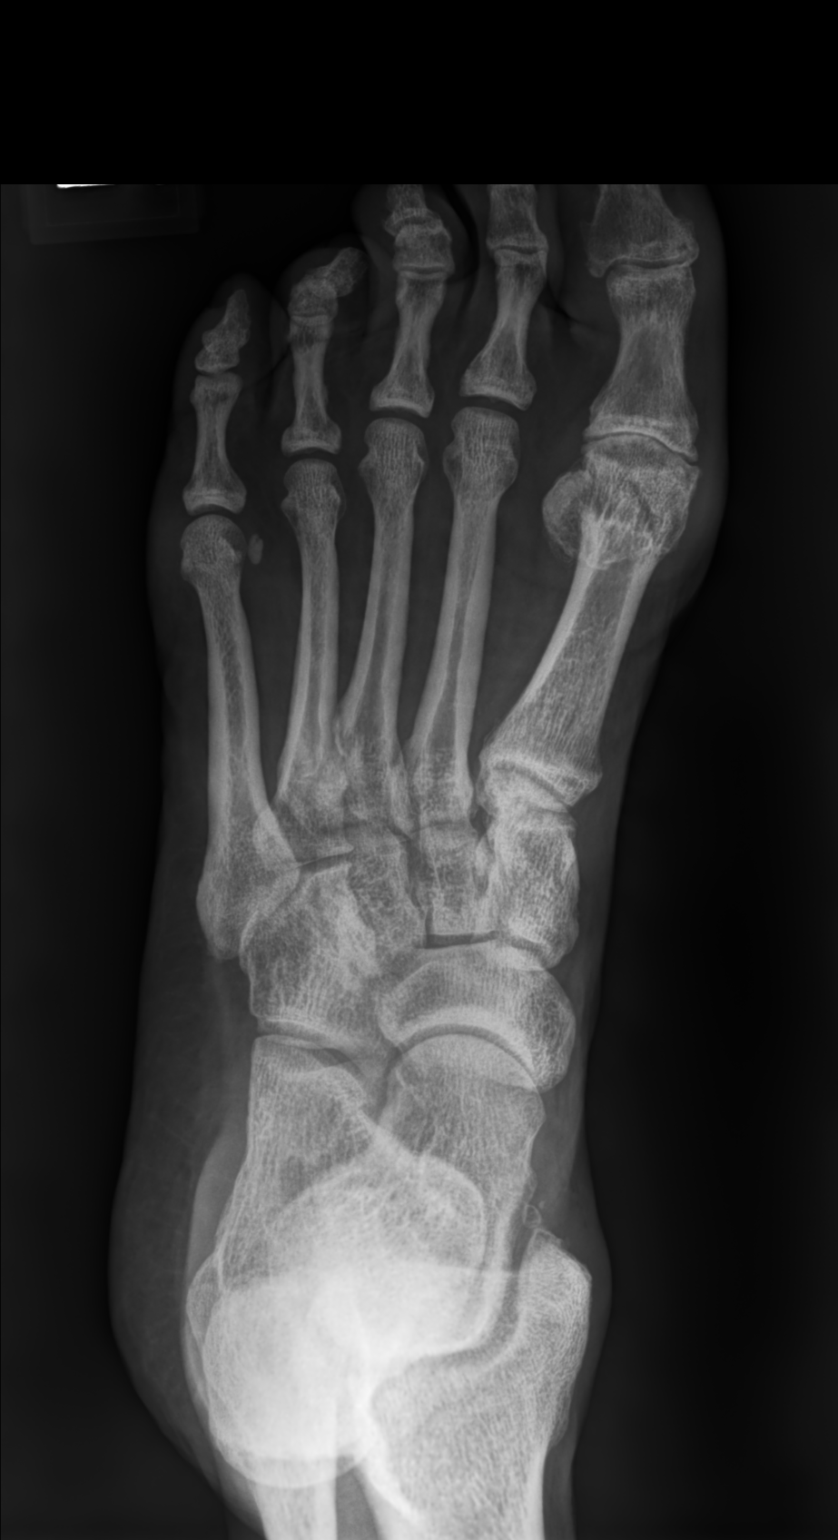

[foot medial oblique]
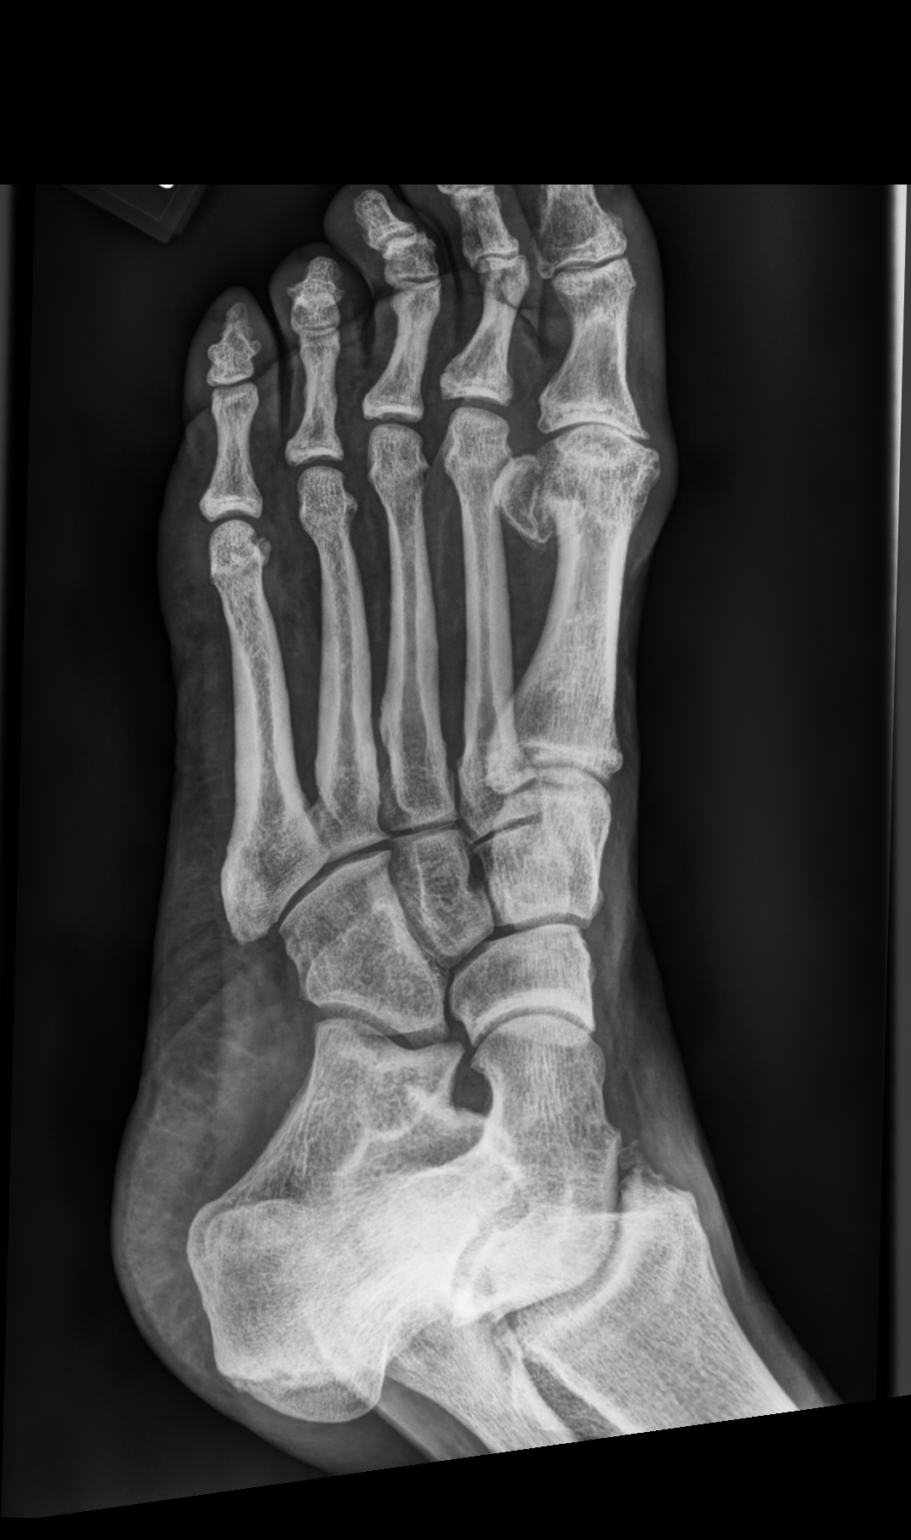

[foot supine lat]
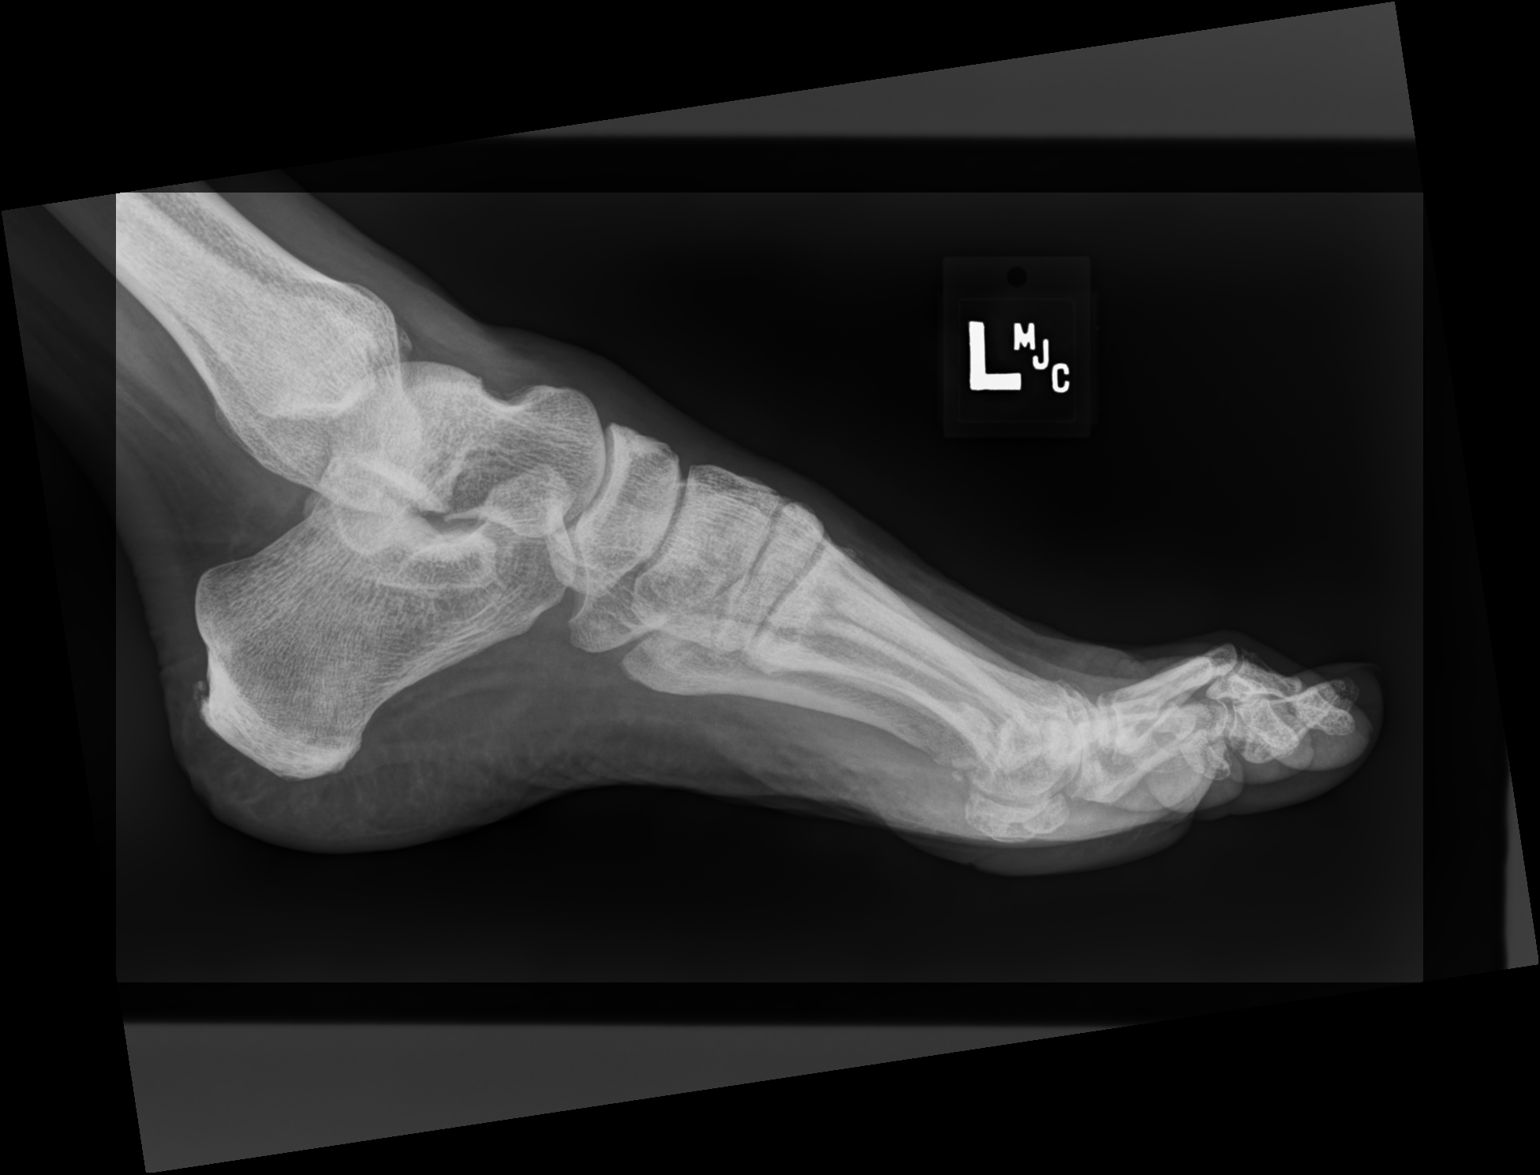

[3 of 3 positions shown; findings below may reference images not displayed]

FINDINGS: Radiolucencies identified in the distal first metatarsal consistent
with known recent fracture. No other fracture or dislocation is
identified. Degenerative joint changes of left foot are noted.
IMPRESSION: Radiolucency identified in the first distal metatarsal consistent
with known recent fracture. No other acute fracture or dislocation
is noted.

## 2021-07-13 ENCOUNTER — Encounter: Payer: Self-pay | Admitting: Gastroenterology

## 2021-07-13 ENCOUNTER — Other Ambulatory Visit: Payer: Self-pay

## 2021-07-13 ENCOUNTER — Ambulatory Visit (INDEPENDENT_AMBULATORY_CARE_PROVIDER_SITE_OTHER): Payer: Medicare (Managed Care) | Admitting: Gastroenterology

## 2021-07-13 ENCOUNTER — Telehealth: Payer: Self-pay

## 2021-07-13 VITALS — BP 102/72 | HR 93 | Ht 66.0 in | Wt 132.3 lb

## 2021-07-13 DIAGNOSIS — R634 Abnormal weight loss: Secondary | ICD-10-CM

## 2021-07-13 DIAGNOSIS — K219 Gastro-esophageal reflux disease without esophagitis: Secondary | ICD-10-CM

## 2021-07-13 DIAGNOSIS — Z1211 Encounter for screening for malignant neoplasm of colon: Secondary | ICD-10-CM | POA: Diagnosis not present

## 2021-07-13 DIAGNOSIS — R1310 Dysphagia, unspecified: Secondary | ICD-10-CM

## 2021-07-13 NOTE — Patient Instructions (Addendum)
If you are age 71 or older, your body mass index should be between 23-30. Your Body mass index is 21.36 kg/m. If this is out of the aforementioned range listed, please consider follow up with your Primary Care Provider.  If you are age 32 or younger, your body mass index should be between 19-25. Your Body mass index is 21.36 kg/m. If this is out of the aformentioned range listed, please consider follow up with your Primary Care Provider.   ________________________________________________________  The [facility] providers would like to encourage you to use [facility] to communicate with providers for non-urgent requests or questions.  Due to long hold times on the telephone, sending your provider a message by [facility] may be a faster and more efficient way to get a response.  Please allow 48 business hours for a response.  Please remember that this is for non-urgent requests.  _______________________________________________________   Dennis Bast have been scheduled for an endoscopy and colonoscopy. Please follow the written instructions given to you at your visit today. Please pick up your prep supplies at the pharmacy within the next 1-3 days. If you use inhalers (even only as needed), please bring them with you on the day of your procedure.   We are giving you a Plenvu sample today to use for your prep for your procedure.  Thank you for entrusting me with your care and for choosing [facility], Dr. Kingsbury Cellar

## 2021-07-13 NOTE — Progress Notes (Signed)
  Amb ref for ECL in the [facility] on 07-28-21

## 2021-07-13 NOTE — Progress Notes (Signed)
HPI :  70 year old male here for a follow-up visit for dysphagia, history of esophageal stricture, colon cancer screening.  Recall I saw him in January 2021 for problems with dysphagia.  He had been experiencing ongoing intermittent solid food dysphagia.  He previously had a barium swallow November 2020 which showed a distal esophageal stricture which reproduces symptoms with a tablet.  He underwent endoscopy with me in March 2021.  He had a distal esophageal stricture in the setting of a 4 cm hiatal hernia.  Stricture was dilated to 13 mm with a balloon with good result.  Within the hernia sac just distal to the stricture he also had some nodular tissue that appear inflamed, suspected related to reflux.  Biopsies showed chronic inflammation with reactive changes, no dysplasia.  I had counseled him and recommended a short interval follow-up EGD to ensure healing and take additional biopsies of the cardia area on PPI, but he failed to follow through with doing that.  I have not seen him since the procedure.  Incidentally on that exam he had a few duodenal ulcers as well and was started on PPI.  Biopsies were negative for H. pylori.  He states the endoscopy last year definitely helped his dysphagia and made significant improvement.  He has not had significant dysphagia since then, although does occasionally have dysphagia to solids and drinks fluids to help push things through at times.  He had an episode while eating a hamburger in the past month where he felt he got stuck for period of time prior to passing, but no impaction.  He has been taking omeprazole 40 mg daily since the last procedure and he states he is not had any reflux symptoms on that.  He is eating well otherwise without any abdominal pains.  He states he has lost some weight since of last seen him.  Over the past year he has lost about 25 pounds or so.  He states on one hand he has been trying to do so and he is happy with the weight loss but  on the other hand does endorse some decreased appetite and has been eating mostly soft foods to minimize his dysphagia.  He mostly eats oatmeal.  He denies any constipation or diarrhea.  No blood in his stools.  He denies any cardiopulmonary symptoms.  He uses a walker to ambulate due to balance problems in light of his diabetes.  He does not drive, receives transportation through [place].  He states they took care of his transportation during his last procedure.  He states his last colonoscopy was over 10 years ago and that he is "due" . He denies any family history of esophageal or colon cancer.  Prior workup:  Barium swallow 08/03/19 - IMPRESSION: 1. Findings consistent with distal esophageal stricture. A 13 mm barium tablet lodged in the distal esophagus just above the GE junction/hiatal hernia. Recommend upper endoscopy for further evaluation. 2. Patient had episode of regurgitation related to the barium tablet blockage 3. Moderate hiatal hernia    EGD 11/16/2019 - Esophagogastric landmarks identified. - 4 cm hiatal hernia. - Moderate esophageal stenosis. Dilated to 75mm with good result. - Nodular / friable mucosal changes in the cardia as outlined, hopefully just due reflux / inflammatory changes but biopsies obtained to rule out dysplasia / malignancy. - Normal stomach otherwise - biopsies taken to rule out H pylori - Non-bleeding 3 small superficial duodenal ulcers with no stigmata of bleeding. - Erythematous duodenopathy.  Diagnosis 1.  Surgical [P], gastric - MILD CHRONIC GASTRITIS. - WARTHIN-STARRY IS NEGATIVE FOR HELICOBACTER PYLORI. - NO INTESTINAL METAPLASIA, DYSPLASIA, OR MALIGNANCY. 2. Surgical [P], esophagus, GE junction, cardia (40-36) - BENIGN GASTRIC TYPE MUCOSA WITH CHRONIC INFLAMMATION AND REACTIVE CHANGES. - NO INTESTINAL METAPLASIA, DYSPLASIA, OR MALIGNANCY.    Past Medical History:  Diagnosis Date   Balance problem    Diabetes mellitus without complication  ([place])    Diabetic neuropathy ([place]) 02/19/2019   Esophageal stricture    Glaucoma    Hiatal hernia    Hyperlipidemia    Hypertension    Numbness      Past Surgical History:  Procedure Laterality Date   COLONOSCOPY     CYST REMOVAL NECK     States he was young   FRACTURE SURGERY     Left foot    GANGLION CYST EXCISION Left    TONSILLECTOMY     Family History  Problem Relation Age of Onset   Diabetes Brother    Hypertension Brother    Obesity Mother    Heart failure Father    Colon cancer Neg Hx    Esophageal cancer Neg Hx    Pancreatic cancer Neg Hx    Stomach cancer Neg Hx    Rectal cancer Neg Hx    Social History   Tobacco Use   Smoking status: Never   Smokeless tobacco: Current    Types: Chew  Vaping Use   Vaping Use: Never used  Substance Use Topics   Alcohol use: Not Currently   Drug use: Never   Current Outpatient Medications  Medication Sig Dispense Refill   atorvastatin (LIPITOR) 10 MG tablet Take 1 tablet (10 mg total) by mouth daily. 30 tablet 2   B Complex-C (SUPER B COMPLEX PO) Take 1 capsule by mouth daily.     busPIRone (BUSPAR) 15 MG tablet TAKE 1 TABLET (15 MG TOTAL) BY MOUTH 2 (TWO) TIMES DAILY. 60 tablet 6   carvedilol (COREG) 3.125 MG tablet Take 1 tablet (3.125 mg total) by mouth 2 (two) times daily with a meal. 60 tablet 2   escitalopram (LEXAPRO) 10 MG tablet Take 1 tablet (10 mg total) by mouth at bedtime. 60 tablet 0   lisinopril (PRINIVIL,ZESTRIL) 40 MG tablet Take 1 tablet (40 mg total) by mouth daily. 30 tablet 2   metFORMIN (GLUCOPHAGE) 1000 MG tablet Take 1 tablet (1,000 mg total) by mouth daily with breakfast. 60 tablet 2   omeprazole (PRILOSEC) 40 MG capsule Take 40 mg by mouth daily.     spironolactone (ALDACTONE) 25 MG tablet Take 1 tablet (25 mg total) by mouth daily. 90 tablet 1   Current Facility-Administered Medications  Medication Dose Route Frequency Provider Last Rate Last Admin   0.9 %  sodium chloride infusion  500 mL  Intravenous Once Rebbie Lauricella, Carlota Raspberry, MD       No Known Allergies   Review of Systems: All systems reviewed and negative except where noted in HPI.   Lab Results  Component Value Date   WBC 6.8 10/16/2018   HGB 12.8 (L) 10/16/2018   HCT 38.1 10/16/2018   MCV 99 (H) 10/16/2018   PLT 211 10/16/2018    Lab Results  Component Value Date   CREATININE 1.02 10/03/2018   BUN 24 10/03/2018   NA 143 10/03/2018   K 5.0 10/03/2018   CL 103 10/03/2018   CO2 21 10/03/2018    Lab Results  Component Value Date   ALT 26 10/03/2018  AST 22 10/03/2018   ALKPHOS 79 10/03/2018   BILITOT 0.5 10/03/2018     Physical Exam: BP 102/72   Pulse 93   Ht 5\' 6"  (1.676 m)   Wt 132 lb 5 oz (60 kg)   SpO2 100%   BMI 21.36 kg/m  Constitutional: Pleasant,well-developed, male in no acute distress. Neurological: Alert and oriented to person place and time. Psychiatric: Normal mood and affect. Behavior is normal.   ASSESSMENT AND PLAN: 70 year old male here for reassessment of following:  Dysphagia GERD Weight loss Colon cancer screening  As above, history of moderate peptic stricture in the setting of moderate hiatal hernia, responded quite well to balloon dilation last year but has had some recurrent dysphagia.  He has been placed on PPI given inflammatory changes noted on last exam.  He had some nodular tissue noted in the hernia sac just inferior to the stricture at the last exam.  Initial biopsy to rule out malignancy but I had intended for him to have a close follow-up exam to rebiopsy the area on PPI.  He failed to do this unfortunately.  He otherwise endorses some weight loss over the past year, sounds like he has intentionally been trying to lose some weight but perhaps more so than he expected.  He states the dysphagia is not too bad but he is eating only soft things to help minimize his symptoms.  In this light I am recommending an EGD to reassess the stricture and polypoid tissue on  the last exam.  I discussed risk benefits of EGD and anesthesia with him and he wants to proceed.  He is otherwise due for routine colon cancer screening, no bowel symptoms bother him.  After discussion of optical colonoscopy, risks and benefits, he wishes to proceed.  Question is the timing of this.  I have openings to do this in the very near future, we were unable to get a hold of [facility] transportation today to see if they can have someone stay with him for the exam and drive him home.  We will coordinate logistics, if they are able to call us back today we could do the procedures as early as tomorrow or the day after given openings in the schedule patient is amenable to do this.  We will touch base with him when we have date and time confirmed.  He will continue omeprazole in the interim.  He agreed with plan  Jolly Mango, MD [facility]

## 2021-07-13 NOTE — Telephone Encounter (Signed)
Leda Gauze called and indicated they did not have the staff to bring the patient and have someone stay with him tomorrow. She indicated she would call the patient's brother to see if he could help but I let her know the patient said he did not want him called. Called the patient and let him know we will not be able to proceed with the procedure tomorrow and he should not start to drink the prep.  We discussed another date however he indicates that it is very hard for him to get a call back from [facility]. He said he WILL call his brother and see if he can take him on Dr. Doyne Keel next opening for an [place] which is Friday, 11-18 at 3:00pm. The # for The[facility] Hinders: [phone]. Patient inidcated he will call me tomorrow to let me know if his brother can bring him on 11-18. Patient booked and Amb Ref resent for 11-18.

## 2021-07-13 NOTE — Telephone Encounter (Signed)
Two messages have been left with [facility] at [phone] for Leda Gauze and one message has been left for Heather to see if patient can be brought for a procedure for tomorrow and if someone can stay with him. Writing on a call back to confirm and will put patient on the schedule per Dr. Havery Moros.  Patient has been instructed and has Plenvu

## 2021-07-14 ENCOUNTER — Encounter: Payer: Medicare (Managed Care) | Admitting: Gastroenterology

## 2021-07-19 ENCOUNTER — Telehealth: Payer: Self-pay

## 2021-07-19 NOTE — Progress Notes (Signed)
Instructions for 11-18 ECL in the lec sent to patient

## 2021-07-19 NOTE — Telephone Encounter (Signed)
Called and spoke to patient. He was able to confirm that his brother CAN bring him for a procedure on Friday 11-18. He has the Plenvu sample we gave him when he was here for his OV. Mailing patient new instructions for 11-18 at 3pm. Patient understands to arrive at 2pm

## 2021-07-28 ENCOUNTER — Encounter: Payer: Self-pay | Admitting: Gastroenterology

## 2021-07-28 ENCOUNTER — Ambulatory Visit (AMBULATORY_SURGERY_CENTER): Payer: Medicare (Managed Care) | Admitting: Gastroenterology

## 2021-07-28 VITALS — BP 148/80 | HR 76 | Temp 97.3°F | Resp 9 | Ht 66.0 in | Wt 132.0 lb

## 2021-07-28 DIAGNOSIS — Z1211 Encounter for screening for malignant neoplasm of colon: Secondary | ICD-10-CM | POA: Diagnosis not present

## 2021-07-28 DIAGNOSIS — D128 Benign neoplasm of rectum: Secondary | ICD-10-CM

## 2021-07-28 DIAGNOSIS — K449 Diaphragmatic hernia without obstruction or gangrene: Secondary | ICD-10-CM | POA: Diagnosis not present

## 2021-07-28 DIAGNOSIS — K317 Polyp of stomach and duodenum: Secondary | ICD-10-CM | POA: Diagnosis not present

## 2021-07-28 DIAGNOSIS — R1310 Dysphagia, unspecified: Secondary | ICD-10-CM | POA: Diagnosis present

## 2021-07-28 DIAGNOSIS — D123 Benign neoplasm of transverse colon: Secondary | ICD-10-CM | POA: Diagnosis not present

## 2021-07-28 DIAGNOSIS — K222 Esophageal obstruction: Secondary | ICD-10-CM

## 2021-07-28 DIAGNOSIS — D125 Benign neoplasm of sigmoid colon: Secondary | ICD-10-CM

## 2021-07-28 DIAGNOSIS — D122 Benign neoplasm of ascending colon: Secondary | ICD-10-CM

## 2021-07-28 MED ORDER — SODIUM CHLORIDE 0.9 % IV SOLN: 500.0000 mL | Freq: Once | INTRAVENOUS | Status: DC

## 2021-07-28 NOTE — Progress Notes (Signed)
A and O x3. Report to RN. Tolerated MAC anesthesia well.Teeth unchanged after procedure. 

## 2021-07-28 NOTE — Op Note (Signed)
[facility] Patient Name: Cody [name] Procedure Date: 07/28/2021 3:16 PM MRN: [id number] Endoscopist: Remo Lipps P. Havery Moros , MD Age: 70 Referring MD:  Date of Birth: April 21, 1951 Gender: Male Account #: [id number] Procedure:                Colonoscopy Indications:              Screening for colorectal malignant neoplasm Medicines:                Monitored Anesthesia Care Procedure:                Pre-Anesthesia Assessment:                           - Prior to the procedure, a History and Physical                            was performed, and patient medications and                            allergies were reviewed. The patient's tolerance of                            previous anesthesia was also reviewed. The risks                            and benefits of the procedure and the sedation                            options and risks were discussed with the patient.                            All questions were answered, and informed consent                            was obtained. Prior Anticoagulants: The patient has                            taken no previous anticoagulant or antiplatelet                            agents. ASA Grade Assessment: III - A patient with                            severe systemic disease. After reviewing the risks                            and benefits, the patient was deemed in                            satisfactory condition to undergo the procedure.                           After obtaining informed consent, the colonoscope  was passed under direct vision. Throughout the                            procedure, the patient's blood pressure, pulse, and                            oxygen saturations were monitored continuously. The                            [id number] PCF-H190TL Slim SB Colonoscope was introduced                            through the anus and advanced to the the cecum,                            identified  by appendiceal orifice and ileocecal                            valve. The colonoscopy was performed without                            difficulty. The patient tolerated the procedure                            well. The quality of the bowel preparation was                            adequate. The ileocecal valve, appendiceal orifice,                            and rectum were photographed. Scope In: 3:32:43 PM Scope Out: 4:12:58 PM Scope Withdrawal Time: 0 hours 29 minutes 55 seconds  Total Procedure Duration: 0 hours 40 minutes 15 seconds  Findings:                 The perianal and digital rectal examinations were                            normal.                           Three sessile polyps were found in the ascending                            colon. The polyps were 3 to 7 mm in size. These                            polyps were removed with a cold snare. Resection                            and retrieval were complete.                           Three sessile polyps were found in the transverse  colon. The polyps were 4 to 6 mm in size. These                            polyps were removed with a cold snare. Resection                            and retrieval were complete.                           A 6-7 mm polyp was found in the sigmoid colon. The                            polyp was sessile. The polyp was removed with a                            cold snare. Resection and retrieval were complete.                           A 3 mm polyp was found in the rectum. The polyp was                            sessile. The polyp was removed with a cold snare.                            Resection and retrieval were complete.                           Multiple small-mouthed diverticula were found in                            the sigmoid colon.                           The colon was tortuous.                           Internal hemorrhoids were found during  retroflexion.                           The exam was otherwise without abnormality. Prep                            was adequate however significant liquid stool                            burden in the right colon. Several minutes taken to                            lavage the colon. Abdominal pressure used to                            achieve cecal intubation. Ultram slim pediatric  scope used to complete this exam. Complications:            No immediate complications. Estimated blood loss:                            Minimal. Estimated Blood Loss:     Estimated blood loss was minimal. Impression:               - Three 3 to 7 mm polyps in the ascending colon,                            removed with a cold snare. Resected and retrieved.                           - Three 4 to 6 mm polyps in the transverse colon,                            removed with a cold snare. Resected and retrieved.                           - One 7 mm polyp in the sigmoid colon, removed with                            a cold snare. Resected and retrieved.                           - One 3 mm polyp in the rectum, removed with a cold                            snare. Resected and retrieved.                           - Diverticulosis in the sigmoid colon.                           - Tortuous colon.                           - Internal hemorrhoids.                           - The examination was otherwise normal. Several                            minutes spent lavaging right colon to achieve                            adequate views. Recommendation:           - Patient has a contact number available for                            emergencies. The signs and symptoms of potential  delayed complications were discussed with the                            patient. Return to normal activities tomorrow.                            Written discharge instructions were provided  to the                            patient.                           - Resume previous diet.                           - Continue present medications.                           - Await pathology results. Remo Lipps P. Tanicia Wolaver, MD 07/28/2021 4:20:11 PM This report has been signed electronically.

## 2021-07-28 NOTE — Op Note (Signed)
[facility] Patient Name: Cody [name] Procedure Date: 07/28/2021 3:17 PM MRN: [id number] Endoscopist: Remo Lipps P. Havery Moros , MD Age: 70 Referring MD:  Date of Birth: 04/26/1951 Gender: Male Account #: [id number] Procedure:                Upper GI endoscopy Indications:              For therapy of esophageal stenosis - history of                            dysphagia, nodular mucosa in the GEJ on prior exam                            (inflammatory related to reflux), now on                            omeprazole, dysphagia improved post dilation but                            recurred Medicines:                Monitored Anesthesia Care Procedure:                Pre-Anesthesia Assessment:                           - Prior to the procedure, a History and Physical                            was performed, and patient medications and                            allergies were reviewed. The patient's tolerance of                            previous anesthesia was also reviewed. The risks                            and benefits of the procedure and the sedation                            options and risks were discussed with the patient.                            All questions were answered, and informed consent                            was obtained. Prior Anticoagulants: The patient has                            taken no previous anticoagulant or antiplatelet                            agents. ASA Grade Assessment: III - A patient with  severe systemic disease. After reviewing the risks                            and benefits, the patient was deemed in                            satisfactory condition to undergo the procedure.                           After obtaining informed consent, the endoscope was                            passed under direct vision. Throughout the                            procedure, the patient's blood pressure, pulse, and                             oxygen saturations were monitored continuously. The                            Endoscope was introduced through the mouth, and                            advanced to the second part of duodenum. The upper                            GI endoscopy was accomplished without difficulty.                            The patient tolerated the procedure well. Scope In: Scope Out: Findings:                 Esophagogastric landmarks were identified: the                            Z-line was found at 36 cm, the gastroesophageal                            junction was found at 36 cm and the upper extent of                            the gastric folds was found at 40 cm from the                            incisors.                           A 4 cm hiatal hernia was present.                           One benign-appearing, intrinsic moderate stenosis                            was  found 36 cm from the incisors. This stenosis                            measured less than one cm (in length). A TTS                            dilator was passed through the scope. Dilation with                            a 12-13.5-15 mm balloon dilator was performed to 12                            mm and 13.5 mm with appropriate mucosal wrents.                           Mucosal changes were found at the gastroesophageal                            junction - slightly focal nodular mucosal just                            inferior to GEJ / stricture. Significantly improved                            compared to prior exam, I suspect inflammatory from                            reflux but biopsies were taken with a cold forceps                            for histology.                           The exam of the esophagus was otherwise normal.                           The entire examined stomach was normal.                           A single 3 to 4 mm sessile polyp was found in the                             second portion of the duodenum. The polyp was                            removed with a cold biopsy forceps. Resection and                            retrieval were complete.                           The exam of the duodenum was otherwise normal. Complications:  No immediate complications. Estimated blood loss:                            Minimal. Estimated Blood Loss:     Estimated blood loss was minimal. Impression:               - Esophagogastric landmarks identified.                           - 4 cm hiatal hernia.                           - Benign-appearing esophageal stenosis. Dilated to                            13.85mm with good result.                           - Mucosal changes in the GEJ as outlined, suspect                            benign inflammatory change. Biopsied.                           - Normal stomach.                           - A single duodenal polyp. Resected and retrieved.                           - Normal duodenum otherwise. Recommendation:           - Patient has a contact number available for                            emergencies. The signs and symptoms of potential                            delayed complications were discussed with the                            patient. Return to normal activities tomorrow.                            Written discharge instructions were provided to the                            patient.                           - Post dilation diet.                           - Continue present medications.                           - Await pathology results. Remo Lipps P. [name], MD 07/28/2021 4:27:32 PM This report has been  signed electronically.

## 2021-07-28 NOTE — Progress Notes (Signed)
Called to room to assist during endoscopic procedure.  Patient ID and intended procedure confirmed with present staff. Received instructions for my participation in the procedure from the performing physician.  

## 2021-07-28 NOTE — Patient Instructions (Signed)
YOU HAD AN ENDOSCOPIC PROCEDURE TODAY AT [facility]:   Refer to the procedure report that was given to you for any specific questions about what was found during the examination.  If the procedure report does not answer your questions, please call your gastroenterologist to clarify.  If you requested that your care partner not be given the details of your procedure findings, then the procedure report has been included in a sealed envelope for you to review at your convenience later.  Information on polyps,  diverticulosis and hemorrhoids given to you today.  Await pathology results.  Resume previous medications.  Post dilation diet today. Clear liquids until 6:20pm today then soft foods the rest of the day.  Regular diet tomorrow.   YOU SHOULD EXPECT: Some feelings of bloating in the abdomen. Passage of more gas than usual.  Walking can help get rid of the air that was put into your GI tract during the procedure and reduce the bloating. If you had a lower endoscopy (such as a colonoscopy or flexible sigmoidoscopy) you may notice spotting of blood in your stool or on the toilet paper. If you underwent a bowel prep for your procedure, you may not have a normal bowel movement for a few days.  Please Note:  You might notice some irritation and congestion in your nose or some drainage.  This is from the oxygen used during your procedure.  There is no need for concern and it should clear up in a day or so.  SYMPTOMS TO REPORT IMMEDIATELY:  Following lower endoscopy (colonoscopy or flexible sigmoidoscopy):  Excessive amounts of blood in the stool  Significant tenderness or worsening of abdominal pains  Swelling of the abdomen that is new, acute  Fever of 100F or higher  Following upper endoscopy (EGD)  Vomiting of blood or coffee ground material  New chest pain or pain under the shoulder blades  Painful or persistently difficult swallowing  New shortness of breath  Fever of  100F or higher  Black, tarry-looking stools  For urgent or emergent issues, a gastroenterologist can be reached at any hour by calling [phone]. Do not use MyChart messaging for urgent concerns.    DIET:  We do recommend a small meal at first, but then you may proceed to your regular diet.  Drink plenty of fluids but you should avoid alcoholic beverages for 24 hours.  ACTIVITY:  You should plan to take it easy for the rest of today and you should NOT DRIVE or use heavy machinery until tomorrow (because of the sedation medicines used during the test).    FOLLOW UP: Our staff will call the number listed on your records 48-72 hours following your procedure to check on you and address any questions or concerns that you may have regarding the information given to you following your procedure. If we do not reach you, we will leave a message.  We will attempt to reach you two times.  During this call, we will ask if you have developed any symptoms of COVID 19. If you develop any symptoms (ie: fever, flu-like symptoms, shortness of breath, cough etc.) before then, please call [phone].  If you test positive for Covid 19 in the 2 weeks post procedure, please call and report this information to [place].    If any biopsies were taken you will be contacted by phone or by letter within the next 1-3 weeks.  Please call us at [phone] if you have not  heard about the biopsies in 3 weeks.    SIGNATURES/CONFIDENTIALITY: You and/or your care partner have signed paperwork which will be entered into your electronic medical record.  These signatures attest to the fact that that the information above on your After Visit Summary has been reviewed and is understood.  Full responsibility of the confidentiality of this discharge information lies with you and/or your care-partner.

## 2021-07-28 NOTE — Progress Notes (Signed)
CHECK-IN-AM  V/S-DT 

## 2021-08-01 ENCOUNTER — Telehealth: Payer: Self-pay | Admitting: *Deleted

## 2021-08-01 NOTE — Telephone Encounter (Signed)
  Follow up Call-  Call back number 07/28/2021 11/16/2019  Post procedure Call Back phone  # [phone] number ok to speak with him. [phone]  Permission to leave phone message No Yes  Some recent data might be hidden     Patient questions:  Do you have a fever, pain , or abdominal swelling? No. Pain Score  0 *  Have you tolerated food without any problems? Yes.    Have you been able to return to your normal activities? Yes.    Do you have any questions about your discharge instructions: Diet   No. Medications  No. Follow up visit  No.  Do you have questions or concerns about your Care? No.  Actions: * If pain score is 4 or above: No action needed, pain <4.  Have you developed a fever since your procedure? no  2.   Have you had an respiratory symptoms (SOB or cough) since your procedure? no  3.   Have you tested positive for COVID 19 since your procedure no  4.   Have you had any family members/close contacts diagnosed with the COVID 19 since your procedure?  no   If yes to any of these questions please route to Joylene John, RN and Joella Prince, RN

## 2021-08-09 ENCOUNTER — Encounter: Payer: Self-pay | Admitting: Gastroenterology

## 2022-08-04 IMAGING — CR DG WRIST COMPLETE 3+V*L*
4 series · 4 of 4 positions shown · non-contrast
Comparison: None.

CLINICAL DATA: Fall several days ago with persistent wrist pain,
initial encounter

EXAM:
LEFT WRIST - COMPLETE 3+ VIEW

[x wrist pa left]
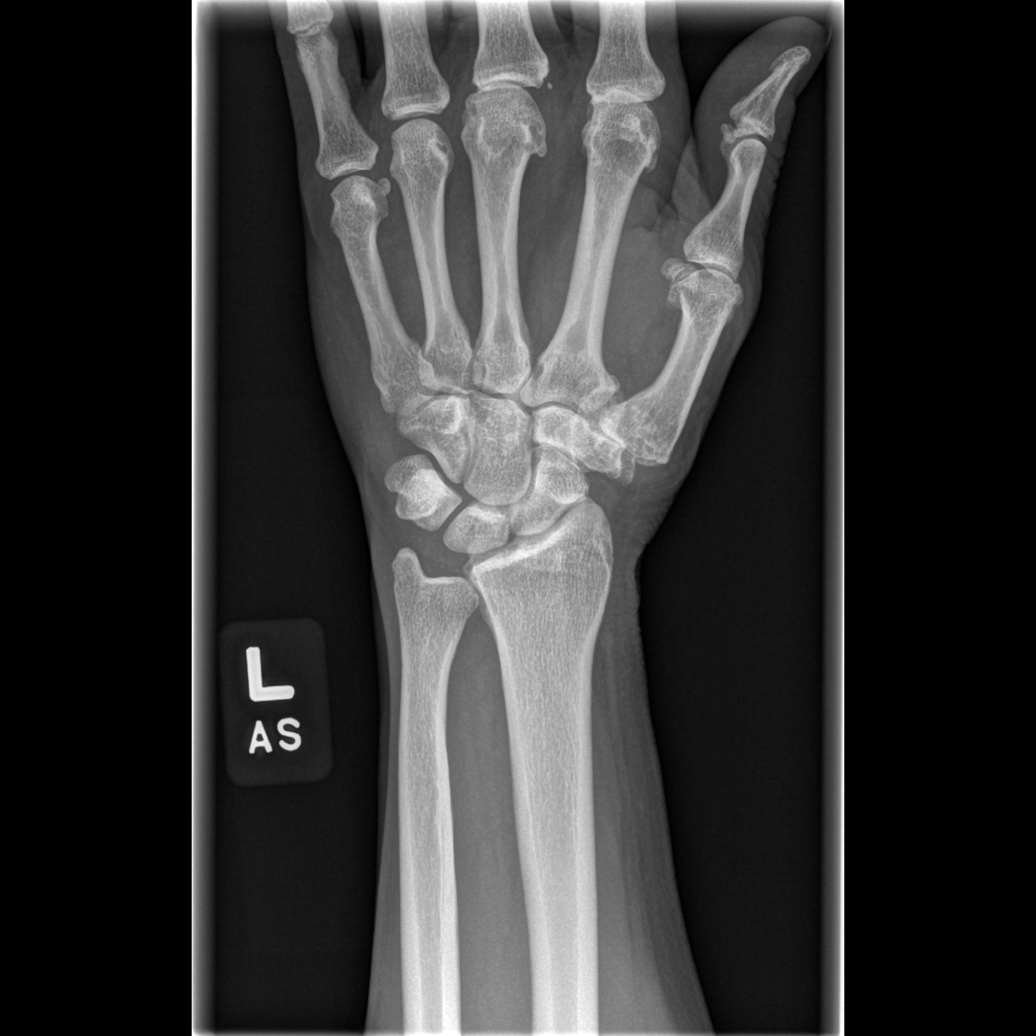

[x wrist obl left]
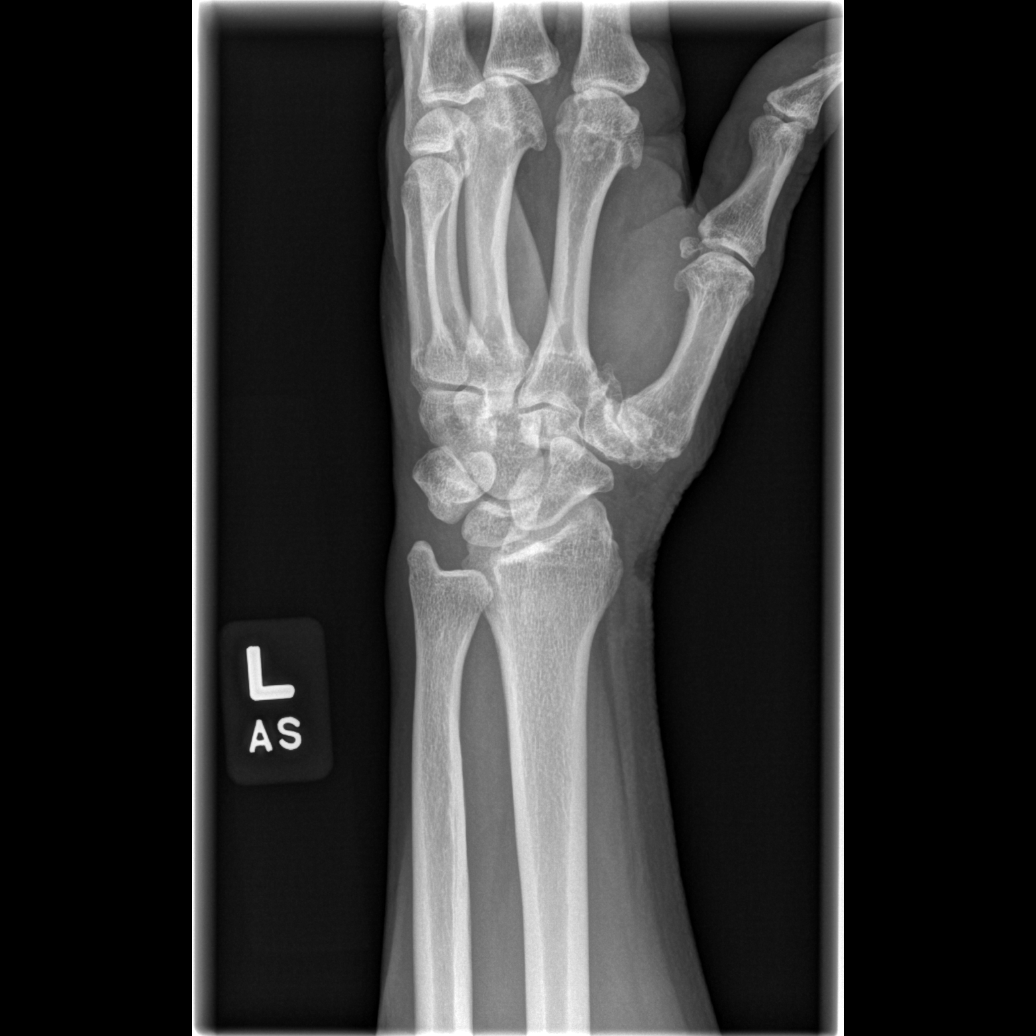

[x wrist lat left]
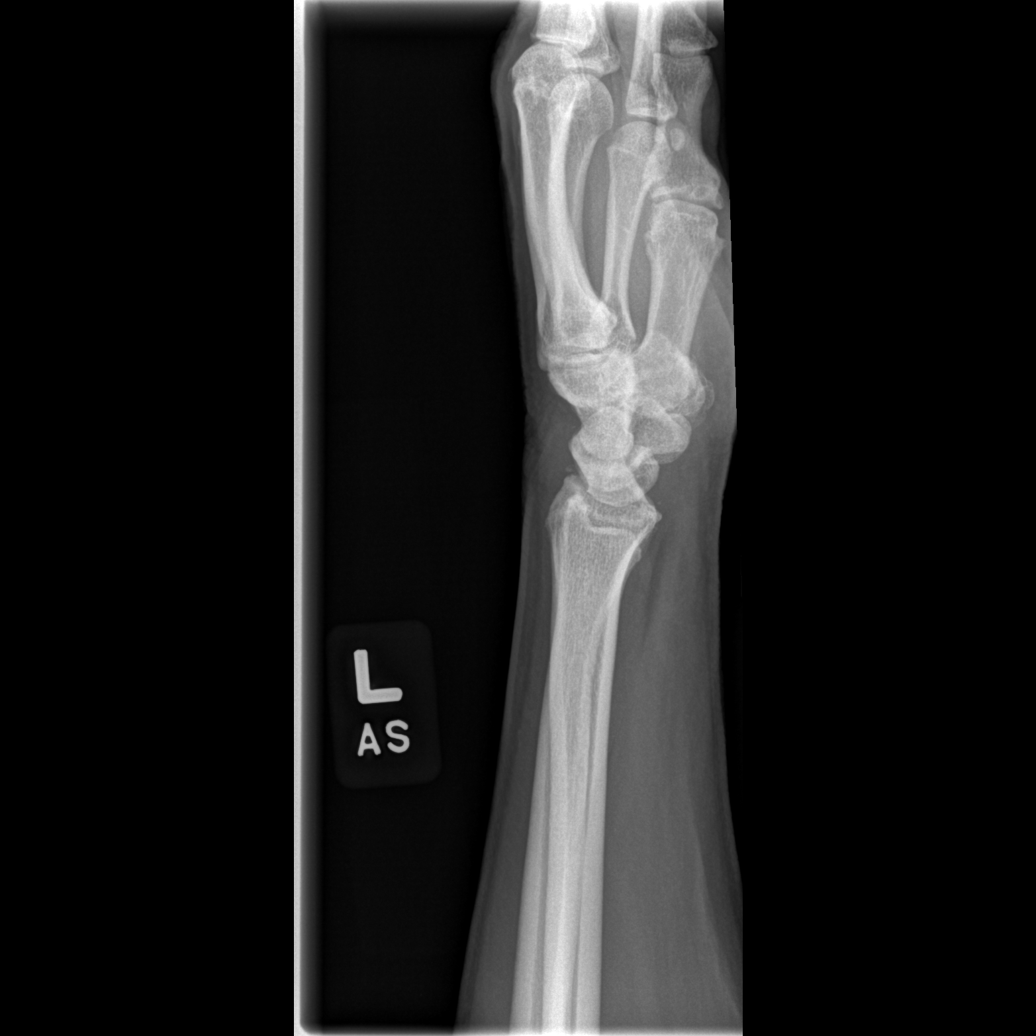

[x navicular]
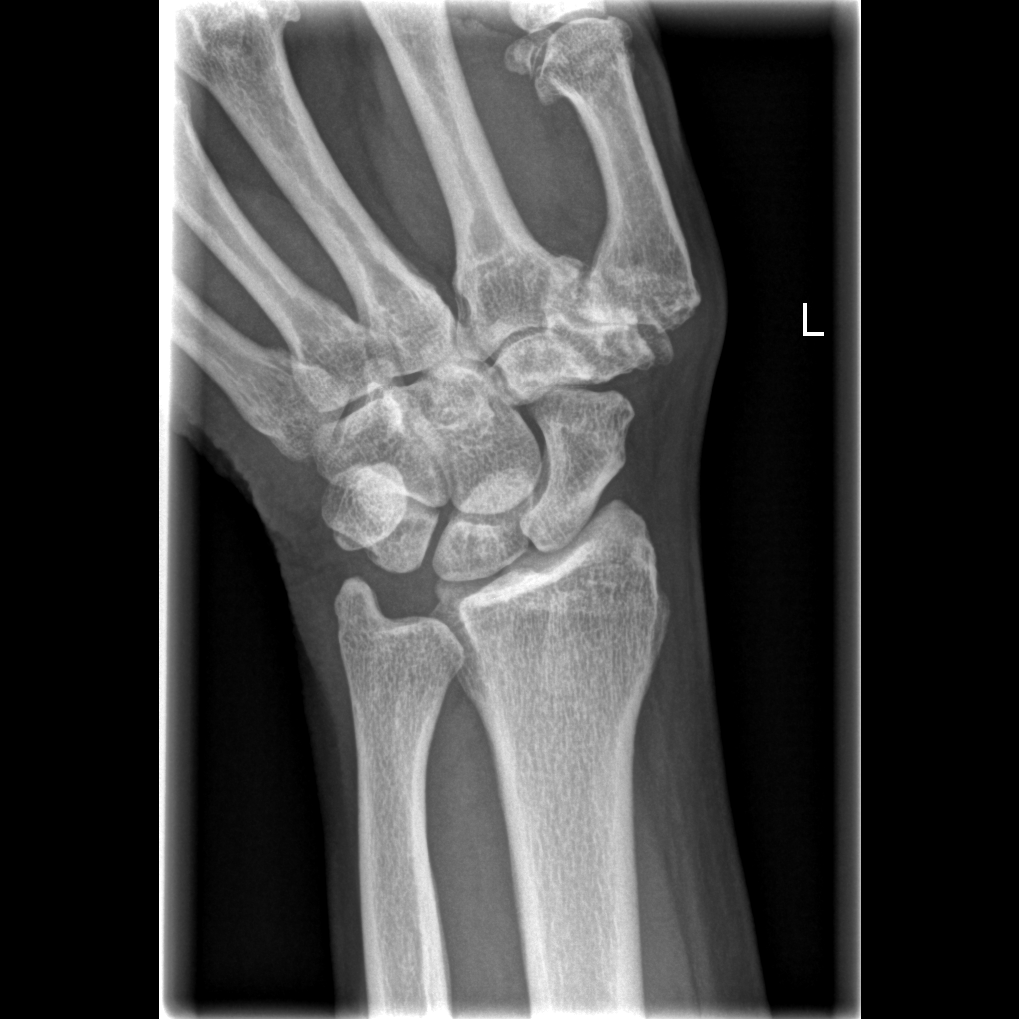

[4 of 4 positions shown; findings below may reference images not displayed]

FINDINGS: Mild degenerative changes at the first CMC joint are noted. Tiny
bony density is noted posterior to the carpal bones which may
represent a small avulsion. Correlation to point tenderness is
recommended. No other focal abnormality is noted.
IMPRESSION: Changes suggestive of small avulsion posterior to the carpal row.
The actual donor site is not well seen.

## 2023-02-26 ENCOUNTER — Telehealth: Payer: Self-pay | Admitting: Gastroenterology

## 2023-02-26 NOTE — Telephone Encounter (Signed)
Urgent referral in [facility] from [facility] for complaints of globus sensation and sensation of food getting stuck in throat. Last saw Dr. Adela Lank in 2022.  Please review and advise scheduling and urgency.  Shanda Bumps @ [facility] [phone]

## 2023-02-26 NOTE — Telephone Encounter (Signed)
I reviewed his chart and he does have a history of an esophageal stricture that I dilated in the past.  His symptoms are likely due to the same and recommend an EGD with dilation.  As long as he has not had any significant changes in cardiopulmonary status, he is not on anticoagulation etc., I do not see any issues there in his chart, I think we can directly book him in the [facility] for an EGD with me.  I should have openings within a few weeks or so, can also add to the wait list to come in sooner if needed.  If he is agreeable with this let me know and we can get him scheduled.  Thanks

## 2023-02-27 NOTE — Telephone Encounter (Signed)
Spoke with [facility].  Patient is scheduled for 03/15/23.  She will stop by office today for instructions per [name],

## 2023-03-13 ENCOUNTER — Telehealth: Payer: Self-pay

## 2023-03-13 NOTE — Telephone Encounter (Signed)
Called patient to offer him a sooner EGD appt in the [facility]. Offered appt on 7/5. Pt states that he no longer wants to have EGD. Pt states that his symptoms started improving last week. He no longer has globus sensation, dysphagia, or food getting stuck. Pt wishes to cancel 03/27/23 EGD appt as well. Pt had no concerns at the end of the call.   03/27/23 EGD cancelled.

## 2023-03-15 ENCOUNTER — Encounter: Payer: Medicare (Managed Care) | Admitting: Gastroenterology

## 2023-03-27 ENCOUNTER — Encounter: Payer: Medicare (Managed Care) | Admitting: Gastroenterology

## 2023-07-04 ENCOUNTER — Telehealth: Payer: Self-pay | Admitting: Gastroenterology

## 2023-07-04 DIAGNOSIS — K222 Esophageal obstruction: Secondary | ICD-10-CM

## 2023-07-04 NOTE — Telephone Encounter (Signed)
Inbound call from [facility] requesting a call to discuss a sooner appointment for patient for esophageal stricture. Patient currently scheduled for 10/04/23. Requesting a call back at [phone]. Please advise, thank you.

## 2023-07-05 NOTE — Telephone Encounter (Signed)
Cody [name] - thanks for letting me know.  [name] / [name] - he needs an EGD at the [facility] for dysphagia, history of esophageal stricture.  I just had 2 cancellations for Monday in the [facility], can you see if he would be willing to come in Monday afternoon for the EGD? Thanks

## 2023-07-05 NOTE — Telephone Encounter (Signed)
Patient has scheduled direct endoscopy on Friday, 07/12/23. He has been advised of time/date/location of upcoming procedure and has been given detailed verbal prep instructions. He has also been advised several times that he will need a care partner 18 years or older to bring him, stay for the procedure and drive him home due to sedation. Patient verbalizes understanding of all information. Regarding medications, patients that he is no longer on any medication. I clarified this several times with him to be sure.

## 2023-07-15 ENCOUNTER — Encounter: Payer: Self-pay | Admitting: Physician Assistant

## 2023-07-15 ENCOUNTER — Ambulatory Visit (INDEPENDENT_AMBULATORY_CARE_PROVIDER_SITE_OTHER): Payer: Medicare (Managed Care) | Admitting: Physician Assistant

## 2023-07-15 VITALS — BP 126/76 | HR 99 | Ht 66.0 in | Wt 138.5 lb

## 2023-07-15 DIAGNOSIS — D123 Benign neoplasm of transverse colon: Secondary | ICD-10-CM

## 2023-07-15 DIAGNOSIS — K222 Esophageal obstruction: Secondary | ICD-10-CM | POA: Diagnosis not present

## 2023-07-15 DIAGNOSIS — E1142 Type 2 diabetes mellitus with diabetic polyneuropathy: Secondary | ICD-10-CM

## 2023-07-15 DIAGNOSIS — R1310 Dysphagia, unspecified: Secondary | ICD-10-CM

## 2023-07-15 DIAGNOSIS — R413 Other amnesia: Secondary | ICD-10-CM

## 2023-07-15 NOTE — Progress Notes (Signed)
07/15/2023 Cody Cody [name] [id number] May 28, 1951  Referring provider: No ref. provider found Primary GI doctor: Dr. Adela Lank  ASSESSMENT AND PLAN:   Dysphagia with history of GERD, esophageal stricture 2022 s/p dilation to 13.61mm with improvement until June of this year now with progressive dysphagia He is off PPI and would not like to start unless it is needed after EGD Schedule EGD with dilatation at [facility], I offered sooner appointment but the patient states he is willing to wait until 11/20 with Dr. Adela Lank.  I discussed risks of EGD with patient today, including risk of sedation, bleeding or perforation.  Patient provides understanding and gave verbal consent to proceed. In the interim patient advised about swallowing precautions.  Eat slowly, Cody [name] food well before swallowing.  Drink liquids in between each bite to avoid food impaction.  Benign neoplasm of transverse colon Recall 07/2024, no changes in bowel habits  Diabetic polyneuropathy associated with type 2 diabetes mellitus (HCC) Walks with walker but able to get on table without issues Monitor sugars Follow up with PCP  Memory impairment Able to consent.  Consider checking B12 Will try to get last notes from [facility]   Patient Care Team: Pcp, No as PCP - General  HISTORY OF PRESENT ILLNESS: 72 y.o. male with a past medical history of HTN, DM with neuropathy, memory impairment, history of GERD, HH, esophageal stricture and others listed below presents for evaluation of dysphagia.   07/28/2021 colonoscopy for screening purposes adequate bowel prep 3 polyps 3 to 7 mm ascending colon 3 polyps 4 to 6 mm transverse colon 1 7 mm polyp sigmoid colon one 3 mm polyp rectum, diverticulosis, tortuous colon, internal hemorrhoids 07/28/2021 EGD with Dr. Adela Lank secondary to dysphagia nodular mucosa in GEJ on prior exam showed 4 cm hiatal hernia, benign-appearing esophageal stenosis dilated 13.5 mm with good results, mucosal  changes in GEJ as outlined suspect benign phonatory changes biopsied, normal stomach, single duodenal polyp, normal duodenum otherwise Pathology showed no precancerous changes in the esophagus small polyp in duodenum adenoma precancerous polyp recall 3 years.  8 polyps were tubular adenomatous polyps recall 3 years for colonoscopy.  He states the EGD in 2022 helped his symptoms until recently.  Patient called in June with globulus sensation and worsening dysphagia.  He has issues with solids food and pills, will feel it stop lower esophagus/stomach and have episodes of regurgitation of liquids if he tries to drink.  He has modified his diet, he is now on soft foods and liquids. No odynophagia.  Denies reflux, has been off his prilosec. He states he does have clearing of his throat.  He states he has had 2 episodes of epigastric AB pain in last 2 years, better after a bowel movements.  No pain with swallowing, no melena, hematochezia.  He has stopped all of his medications a month ago, he felt he didn't need them and has stopped. BP has been stable, he states his sugars have been stable. Denies heart history, no heart failure.  Uses walker due to his neuropathy.  [facility] with [facility].   He denies blood thinner use.  He denies NSAID use.  He denies ETOH use.   He denies tobacco use.  He denies drug use.    Wt Readings from Last 3 Encounters:  07/15/23 138 lb 8 oz (62.8 kg)  07/28/21 132 lb (59.9 kg)  07/13/21 132 lb 5 oz (60 kg)     He  reports that he has never smoked. His  smokeless tobacco use includes Cody [name]. He reports that he does not currently use alcohol. He reports that he does not use drugs.  RELEVANT LABS AND IMAGING:  CBC    Component Value Date/Time   WBC 6.8 10/16/2018 0836   RBC 3.84 (L) 10/16/2018 0836   HGB 12.8 (L) 10/16/2018 0836   HCT 38.1 10/16/2018 0836   PLT 211 10/16/2018 0836   MCV 99 (H) 10/16/2018 0836   MCH 33.3 (H) 10/16/2018 0836   MCHC 33.6  10/16/2018 0836   RDW 12.1 10/16/2018 0836   LYMPHSABS 1.2 10/16/2018 0836   EOSABS 0.3 10/16/2018 0836   BASOSABS 0.1 10/16/2018 0836   No results for input(s): "HGB" in the last 8760 hours.  CMP     Component Value Date/Time   NA 143 10/03/2018 1053   K 5.0 10/03/2018 1053   CL 103 10/03/2018 1053   CO2 21 10/03/2018 1053   GLUCOSE 104 (H) 10/03/2018 1053   BUN 24 10/03/2018 1053   CREATININE 1.02 10/03/2018 1053   CALCIUM 10.1 10/03/2018 1053   PROT 7.1 11/06/2018 1131   ALBUMIN 4.6 10/03/2018 1053   AST 22 10/03/2018 1053   ALT 26 10/03/2018 1053   ALKPHOS 79 10/03/2018 1053   BILITOT 0.5 10/03/2018 1053   GFRNONAA 75 10/03/2018 1053   GFRAA 87 10/03/2018 1053      Latest Ref Rng & Units 11/06/2018   11:31 AM 10/03/2018   10:53 AM  Hepatic Function  Total Protein 6.0 - 8.5 g/dL 7.1  6.9   Albumin 3.8 - 4.8 g/dL  4.6   AST 0 - 40 IU/L  22   ALT 0 - 44 IU/L  26   Alk Phosphatase 39 - 117 IU/L  79   Total Bilirubin 0.0 - 1.2 mg/dL  0.5       Current Medications:   Current Outpatient Medications (Endocrine & Metabolic):    metFORMIN (GLUCOPHAGE) 1000 MG tablet, Take 1 tablet (1,000 mg total) by mouth daily with breakfast. (Patient not taking: Reported on 07/05/2023)   Current Outpatient Medications (Cardiovascular):    atorvastatin (LIPITOR) 10 MG tablet, Take 1 tablet (10 mg total) by mouth daily. (Patient not taking: Reported on 07/05/2023)   carvedilol (COREG) 3.125 MG tablet, Take 1 tablet (3.125 mg total) by mouth 2 (two) times daily with a meal. (Patient not taking: Reported on 07/05/2023)   lisinopril (PRINIVIL,ZESTRIL) 40 MG tablet, Take 1 tablet (40 mg total) by mouth daily. (Patient not taking: Reported on 07/05/2023)   spironolactone (ALDACTONE) 25 MG tablet, Take 1 tablet (25 mg total) by mouth daily. (Patient not taking: Reported on 07/05/2023)         Current Outpatient Medications (Other):    B Complex-C (SUPER B COMPLEX PO), Take 1 capsule  by mouth daily. (Patient not taking: Reported on 07/05/2023)   busPIRone (BUSPAR) 15 MG tablet, TAKE 1 TABLET (15 MG TOTAL) BY MOUTH 2 (TWO) TIMES DAILY. (Patient not taking: Reported on 07/05/2023)   escitalopram (LEXAPRO) 10 MG tablet, Take 1 tablet (10 mg total) by mouth at bedtime. (Patient not taking: Reported on 07/05/2023)   omeprazole (PRILOSEC) 40 MG capsule, Take 40 mg by mouth daily. (Patient not taking: Reported on 07/05/2023)  Current Facility-Administered Medications (Other):    0.9 %  sodium chloride infusion  Medical History:  Past Medical History:  Diagnosis Date   Arthritis    Balance problem    Cataract    Depression    Diabetes mellitus without complication (  HCC)    Diabetic neuropathy (HCC) 02/19/2019   Esophageal stricture    Glaucoma    Hiatal hernia    Hyperlipidemia    Hypertension    Numbness    Allergies: No Known Allergies   Surgical History:  He  has a past surgical history that includes Cyst removal neck; Tonsillectomy; Fracture surgery; Ganglion cyst excision (Left); and Colonoscopy. Family History:  His family history includes Diabetes in his brother; Heart failure in his father; Hypertension in his brother; Obesity in his mother.  REVIEW OF SYSTEMS  : All other systems reviewed and negative except where noted in the History of Present Illness.  PHYSICAL EXAM: BP 126/76   Pulse 99   Ht 5\' 6"  (1.676 m)   Wt 138 lb 8 oz (62.8 kg)   BMI 22.35 kg/m  General Appearance: elderly appearing,  in no apparent distress. Head:   Normocephalic and atraumatic. Poor dentition Eyes:  sclerae anicteric,conjunctive pink  Respiratory: Respiratory effort normal, BS equal bilaterally without rales, rhonchi, wheezing. Cardio: RRR with no MRGs. Peripheral pulses intact.  Abdomen: Soft,  Non-distended ,active bowel sounds. No tenderness . No masses. Rectal: Not evaluated Musculoskeletal: Full ROM, Ambulates with walker but able to get on table with no  assistance, Without edema. Skin:  Dry and intact without significant lesions or rashes Neuro: Alert and  oriented x4;  No focal deficits. Psych:  Cooperative. Normal mood and affect.    Doree Albee, PA-C 3:14 PM

## 2023-07-15 NOTE — Patient Instructions (Addendum)
You have been scheduled for an endoscopy. Please follow written instructions given to you at your visit today.  If you use inhalers (even only as needed), please bring them with you on the day of your procedure.  If you take any of the following medications, they will need to be adjusted prior to your procedure:   DO NOT TAKE 7 DAYS PRIOR TO TEST- Trulicity (dulaglutide) Ozempic, Wegovy (semaglutide) Mounjaro (tirzepatide) Bydureon Bcise (exanatide extended release)  DO NOT TAKE 1 DAY PRIOR TO YOUR TEST Rybelsus (semaglutide) Adlyxin (lixisenatide) Victoza (liraglutide) Byetta (exanatide) ___________________________________________________________________________   _______________________________________________________  If your blood pressure at your visit was 140/90 or greater, please contact your primary care physician to follow up on this.  _______________________________________________________  If you are age 18 or older, your body mass index should be between 23-30. Your Body mass index is 22.35 kg/m. If this is out of the aforementioned range listed, please consider follow up with your Primary Care Provider.  If you are age 33 or younger, your body mass index should be between 19-25. Your Body mass index is 22.35 kg/m. If this is out of the aformentioned range listed, please consider follow up with your Primary Care Provider.   ________________________________________________________  The [facility] providers would like to encourage you to use [facility] to communicate with providers for non-urgent requests or questions.  Due to long hold times on the telephone, sending your provider a message by [facility] may be a faster and more efficient way to get a response.  Please allow 48 business hours for a response.  Please remember that this is for non-urgent requests.  _______________________________________________________  Due to recent changes in healthcare laws, you may see  the results of your imaging and laboratory studies on MyChart before your provider has had a chance to review them.  We understand that in some cases there may be results that are confusing or concerning to you. Not all laboratory results come back in the same time frame and the provider may be waiting for multiple results in order to interpret others.  Please give [place] 48 hours in order for your provider to thoroughly review all the results before contacting the office for clarification of your results.     Marland Kitchen

## 2023-07-15 NOTE — Progress Notes (Signed)
Agree with assessment and plan as outlined.  

## 2023-07-19 ENCOUNTER — Telehealth: Payer: Self-pay | Admitting: Physician Assistant

## 2023-07-19 NOTE — Telephone Encounter (Signed)
[facility] called to reschedule EGD. They need to have updated instructions mailed to [address] [place], [place] [address] attn: Shanda Bumps

## 2023-07-31 ENCOUNTER — Encounter: Payer: Medicare (Managed Care) | Admitting: Gastroenterology

## 2023-08-22 ENCOUNTER — Ambulatory Visit (AMBULATORY_SURGERY_CENTER): Payer: Medicare (Managed Care) | Admitting: Gastroenterology

## 2023-08-22 ENCOUNTER — Encounter: Payer: Self-pay | Admitting: Gastroenterology

## 2023-08-22 VITALS — BP 112/64 | HR 108 | Temp 88.0°F | Resp 22 | Ht 66.0 in | Wt 138.0 lb

## 2023-08-22 DIAGNOSIS — R1310 Dysphagia, unspecified: Secondary | ICD-10-CM | POA: Diagnosis not present

## 2023-08-22 DIAGNOSIS — K449 Diaphragmatic hernia without obstruction or gangrene: Secondary | ICD-10-CM

## 2023-08-22 DIAGNOSIS — K222 Esophageal obstruction: Secondary | ICD-10-CM

## 2023-08-22 MED ORDER — OMEPRAZOLE 40 MG PO CPDR: 40.0000 mg | DELAYED_RELEASE_CAPSULE | Freq: Every day | ORAL | 3 refills | Status: DC

## 2023-08-22 MED ORDER — SODIUM CHLORIDE 0.9 % IV SOLN: 500.0000 mL | Freq: Once | INTRAVENOUS | Status: DC

## 2023-08-22 NOTE — Progress Notes (Signed)
[facility] History and Physical   Primary Care Physician:  Pcp, No   Reason for Procedure:   Dysphagia, esophageal stricture, history of duodenal adenoma  Plan:    EGD with dilation     HPI: Cody [name] is a 72 y.o. male  here for EGD with dilation. History of esophageal stricture dilated to 13.17mm in 2022. He stopped PPI. 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     Otherwise feels well without any cardiopulmonary symptoms.   I have discussed risks / benefits of anesthesia and endoscopic procedure with Reesa Chew and they wish to proceed with the exams as outlined today.    Past Medical History:  Diagnosis Date   Arthritis    Balance problem    Cataract    Depression    Diabetes mellitus without complication (HCC)    Diabetic neuropathy (HCC) 02/19/2019   Esophageal stricture    Glaucoma    Hiatal hernia    Hyperlipidemia    Hypertension    Numbness     Past Surgical History:  Procedure Laterality Date   COLONOSCOPY     CYST REMOVAL NECK     States he was young   FRACTURE SURGERY     Left foot    GANGLION CYST EXCISION Left    TONSILLECTOMY      Prior to Admission medications   Medication Sig Start Date End Date Taking? Authorizing Provider  atorvastatin (LIPITOR) 10 MG tablet Take 1 tablet (10 mg total) by mouth daily. Patient not taking: Reported on 08/22/2023 10/17/18   Bing Neighbors, NP  B Complex-C (SUPER B COMPLEX PO) Take 1 capsule by mouth daily. Patient not taking: Reported on 08/22/2023    [provider]  busPIRone (BUSPAR) 15 MG tablet TAKE 1 TABLET (15 MG TOTAL) BY MOUTH 2 (TWO) TIMES DAILY. Patient not taking: Reported on 08/22/2023 11/20/18   Bing Neighbors, NP  carvedilol (COREG) 3.125 MG tablet Take 1 tablet (3.125 mg total) by mouth 2 (two) times daily with a meal. Patient not taking: Reported on 08/22/2023 10/17/18   Bing Neighbors, NP  escitalopram (LEXAPRO) 10 MG tablet Take 1 tablet (10  mg total) by mouth at bedtime. Patient not taking: Reported on 08/22/2023 10/17/18   Bing Neighbors, NP  lisinopril (PRINIVIL,ZESTRIL) 40 MG tablet Take 1 tablet (40 mg total) by mouth daily. Patient not taking: Reported on 08/22/2023 10/17/18   Bing Neighbors, NP  metFORMIN (GLUCOPHAGE) 1000 MG tablet Take 1 tablet (1,000 mg total) by mouth daily with breakfast. Patient not taking: Reported on 08/22/2023 10/17/18   Bing Neighbors, NP  omeprazole (PRILOSEC) 40 MG capsule Take 40 mg by mouth daily. Patient not taking: Reported on 08/22/2023    [provider]  spironolactone (ALDACTONE) 25 MG tablet Take 1 tablet (25 mg total) by mouth daily. Patient not taking: Reported on 08/22/2023 10/17/18   Bing Neighbors, NP    Current Outpatient Medications  Medication Sig Dispense Refill   atorvastatin (LIPITOR) 10 MG tablet Take 1 tablet (10 mg total) by mouth daily. (Patient not taking: Reported on 08/22/2023) 30 tablet 2   B Complex-C (SUPER B COMPLEX PO) Take 1 capsule by mouth daily. (Patient not taking: Reported on 08/22/2023)     busPIRone (BUSPAR) 15 MG tablet TAKE 1 TABLET (15 MG TOTAL) BY MOUTH 2 (TWO) TIMES DAILY. (Patient not taking: Reported on 08/22/2023) 60 tablet 6   carvedilol (COREG) 3.125 MG tablet Take  1 tablet (3.125 mg total) by mouth 2 (two) times daily with a meal. (Patient not taking: Reported on 08/22/2023) 60 tablet 2   escitalopram (LEXAPRO) 10 MG tablet Take 1 tablet (10 mg total) by mouth at bedtime. (Patient not taking: Reported on 08/22/2023) 60 tablet 0   lisinopril (PRINIVIL,ZESTRIL) 40 MG tablet Take 1 tablet (40 mg total) by mouth daily. (Patient not taking: Reported on 08/22/2023) 30 tablet 2   metFORMIN (GLUCOPHAGE) 1000 MG tablet Take 1 tablet (1,000 mg total) by mouth daily with breakfast. (Patient not taking: Reported on 08/22/2023) 60 tablet 2   omeprazole (PRILOSEC) 40 MG capsule Take 40 mg by mouth daily. (Patient not taking: Reported on  08/22/2023)     spironolactone (ALDACTONE) 25 MG tablet Take 1 tablet (25 mg total) by mouth daily. (Patient not taking: Reported on 08/22/2023) 90 tablet 1   Current Facility-Administered Medications  Medication Dose Route Frequency Provider Last Rate Last Admin   0.9 %  sodium chloride infusion  500 mL Intravenous Once Kenyatta Gloeckner, Willaim Rayas, MD       0.9 %  sodium chloride infusion  500 mL Intravenous Once Aydrian Halpin, Willaim Rayas, MD        Allergies as of 08/22/2023   (No Known Allergies)    Family History  Problem Relation Age of Onset   Obesity Mother    Heart failure Father    Diabetes Brother    Hypertension Brother    Colon cancer Neg Hx    Esophageal cancer Neg Hx    Pancreatic cancer Neg Hx    Stomach cancer Neg Hx    Rectal cancer Neg Hx    Colon polyps Neg Hx     Social History   Socioeconomic History   Marital status: Single    Spouse name: Not on file   Number of children: 0   Years of education: Not on file   Highest education level: 12th grade  Occupational History   Occupation: Retired   Tobacco Use   Smoking status: Never   Smokeless tobacco: Current    Types: Designer, multimedia Use   Vaping status: Never Used  Substance and Sexual Activity   Alcohol use: Not Currently   Drug use: Never   Sexual activity: Not on file  Other Topics Concern   Not on file  Social History Narrative   Right handed    Caffeine 1-2 cups daily    Lives at home with brother Jonny Ruiz    Social Drivers of Corporate investment banker Strain: Not on file  Food Insecurity: Not on file  Transportation Needs: Not on file  Physical Activity: Not on file  Stress: Not on file  Social Connections: Not on file  Intimate Partner Violence: Not on file    Review of Systems: All other review of systems negative except as mentioned in the HPI.  Physical Exam: Vital signs BP 133/73   Temp (!) 88 F (31.1 C)   Ht 5\' 6"  (1.676 m)   Wt 138 lb (62.6 kg)   SpO2 99%   BMI 22.27 kg/m    General:   Alert,  Well-developed, pleasant and cooperative in NAD Lungs:  Clear throughout to auscultation.   Heart:  Regular rate and rhythm Abdomen:  Soft, nontender and nondistended.   Neuro/Psych:  Alert and cooperative. Normal mood and affect. A and O x 3  Harlin Rain, MD [facility]

## 2023-08-22 NOTE — Patient Instructions (Addendum)
Follow post dilation diet today. Repeat upper endoscopy on January 9th at 4 pm. Continue taking omeprazole 40 mg per day as ordered   YOU HAD AN ENDOSCOPIC PROCEDURE TODAY AT THE [facility]:   Refer to the procedure report that was given to you for any specific questions about what was found during the examination.  If the procedure report does not answer your questions, please call your gastroenterologist to clarify.  If you requested that your care partner not be given the details of your procedure findings, then the procedure report has been included in a sealed envelope for you to review at your convenience later.  YOU SHOULD EXPECT: Some feelings of bloating in the abdomen. Passage of more gas than usual.  Walking can help get rid of the air that was put into your GI tract during the procedure and reduce the bloating. If you had a lower endoscopy (such as a colonoscopy or flexible sigmoidoscopy) you may notice spotting of blood in your stool or on the toilet paper. If you underwent a bowel prep for your procedure, you may not have a normal bowel movement for a few days.  Please Note:  You might notice some irritation and congestion in your nose or some drainage.  This is from the oxygen used during your procedure.  There is no need for concern and it should clear up in a day or so.  SYMPTOMS TO REPORT IMMEDIATELY:  Following upper endoscopy (EGD)  Vomiting of blood or coffee ground material  New chest pain or pain under the shoulder blades  Painful or persistently difficult swallowing  New shortness of breath  Fever of 100F or higher  Black, tarry-looking stools  For urgent or emergent issues, a gastroenterologist can be reached at any hour by calling [phone]. Do not use MyChart messaging for urgent concerns.    DIET:  We do recommend a small meal at first, but then you may proceed to your regular diet.  Drink plenty of fluids but you should avoid alcoholic beverages  for 24 hours.  ACTIVITY:  You should plan to take it easy for the rest of today and you should NOT DRIVE or use heavy machinery until tomorrow (because of the sedation medicines used during the test).    FOLLOW UP: Our staff will call the number listed on your records the next business day following your procedure.  We will call around 7:15- 8:00 am to check on you and address any questions or concerns that you may have regarding the information given to you following your procedure. If we do not reach you, we will leave a message.     If any biopsies were taken you will be contacted by phone or by letter within the next 1-3 weeks.  Please call us at [phone] if you have not heard about the biopsies in 3 weeks.    SIGNATURES/CONFIDENTIALITY: You and/or your care partner have signed paperwork which will be entered into your electronic medical record.  These signatures attest to the fact that that the information above on your After Visit Summary has been reviewed and is understood.  Full responsibility of the confidentiality of this discharge information lies with you and/or your care-partner.

## 2023-08-22 NOTE — Progress Notes (Signed)
Sedate, gd SR, tolerated procedure well, VSS, report to RN 

## 2023-08-22 NOTE — Progress Notes (Signed)
Called to room to assist during endoscopic procedure.  Patient ID and intended procedure confirmed with present staff. Received instructions for my participation in the procedure from the performing physician.  

## 2023-08-22 NOTE — Progress Notes (Signed)
Pt's states no medical or surgical changes since previsit or office visit. 

## 2023-08-22 NOTE — Op Note (Signed)
[facility] Patient Name: Cody [name] Procedure Date: 08/22/2023 10:30 AM MRN: [id number] Endoscopist: Viviann Spare P. Adela Lank , MD, [id number] Age: 72 Referring MD:  Date of Birth: [birth year] Gender: Male Account #: [id number] Procedure:                Upper GI endoscopy Indications:              Dysphagia - history of esophageal stricture s/p                            dilation in 2022 - prescribed omeprazole but not                            taking. History of small duodenal adenoma removed                            2022. Medicines:                Monitored Anesthesia Care Procedure:                Pre-Anesthesia Assessment:                           - Prior to the procedure, a History and Physical                            was performed, and patient medications and                            allergies were reviewed. The patient's tolerance of                            previous anesthesia was also reviewed. The risks                            and benefits of the procedure and the sedation                            options and risks were discussed with the patient.                            All questions were answered, and informed consent                            was obtained. Prior Anticoagulants: The patient has                            taken no anticoagulant or antiplatelet agents. ASA                            Grade Assessment: II - A patient with mild systemic                            disease. After reviewing the risks and benefits,  the patient was deemed in satisfactory condition to                            undergo the procedure.                           After obtaining informed consent, the endoscope was                            passed under direct vision. Throughout the                            procedure, the patient's blood pressure, pulse, and                            oxygen saturations were monitored continuously.  The                            GIF [id number] #[id number] was introduced through the                            mouth, and advanced to the second part of duodenum.                            The upper GI endoscopy was accomplished without                            difficulty. The patient tolerated the procedure                            well. Scope In: Scope Out: Findings:                 Esophagogastric landmarks were identified: the                            Z-line was found at 37 cm, the gastroesophageal                            junction was found at 37 cm and the upper extent of                            the gastric folds was found at 39 cm from the                            incisors.                           A 2 cm hiatal hernia was present.                           One benign-appearing, intrinsic stenosis was found                            37 cm from the incisors. This stenosis  measured                            less than one cm (in length). The stenosis was                            traversed with the endoscope using gentle pressure,                            which dilated the stricture itself. There was                            slight nodularity distal to the stricture. Biopsies                            were taken with a cold forceps for histology and to                            open the stricture further.                           The exam of the esophagus was otherwise normal.                           The entire examined stomach was normal.                           Two diminutive sessile polyps were found in the                            second portion of the duodenum. These polyps were                            removed with a cold biopsy forceps. Resection and                            retrieval were complete.                           The exam of the duodenum was otherwise normal. Complications:            No immediate complications. Estimated blood loss:                             Minimal. Estimated Blood Loss:     Estimated blood loss was minimal. Impression:               - Esophagogastric landmarks identified.                           - 2 cm hiatal hernia.                           - Benign-appearing esophageal stenosis. Dilated  with endoscope itself and biopsied.                           - Normal stomach.                           - Two duodenal polyps. Resected and retrieved.                           - Normal duodenum otherwise.                           Anticipate patient will experience benefit from the                            dilation but symptoms will likely not resolve.                            Recommend repeat EGD in 3-4 weeks for repeat                            dilation to open the stricture further. Recommendation:           - Patient has a contact number available for                            emergencies. The signs and symptoms of potential                            delayed complications were discussed with the                            patient. Return to normal activities tomorrow.                            Written discharge instructions were provided to the                            patient.                           - Post dilation diet, would stay on soft diet until                            repeat EGD can be done to open stricture further                           - Continue present medications.                           - Resume omeprazole 40mg  / day if not taking, help                            prevent stricture recurrence.                           -  Await pathology results. Viviann Spare P. Siris Hoos, MD 08/22/2023 10:47:27 AM This report has been signed electronically.

## 2023-08-23 ENCOUNTER — Telehealth: Payer: Self-pay | Admitting: *Deleted

## 2023-08-23 NOTE — Telephone Encounter (Signed)
Reached [facility] on number left for f/u call, no message left due to adm note for call back said no to permission to leave message

## 2023-08-27 LAB — SURGICAL PATHOLOGY

## 2023-08-28 ENCOUNTER — Encounter: Payer: Self-pay | Admitting: Gastroenterology

## 2023-09-05 ENCOUNTER — Ambulatory Visit: Payer: Medicare (Managed Care) | Admitting: Gastroenterology

## 2023-09-19 ENCOUNTER — Ambulatory Visit (AMBULATORY_SURGERY_CENTER): Payer: Medicare (Managed Care) | Admitting: Gastroenterology

## 2023-09-19 ENCOUNTER — Encounter: Payer: Self-pay | Admitting: Gastroenterology

## 2023-09-19 VITALS — BP 123/89 | HR 85 | Temp 97.0°F | Resp 12 | Ht 66.0 in | Wt 138.0 lb

## 2023-09-19 DIAGNOSIS — K449 Diaphragmatic hernia without obstruction or gangrene: Secondary | ICD-10-CM | POA: Diagnosis present

## 2023-09-19 DIAGNOSIS — R1310 Dysphagia, unspecified: Secondary | ICD-10-CM

## 2023-09-19 DIAGNOSIS — K222 Esophageal obstruction: Secondary | ICD-10-CM

## 2023-09-19 MED ORDER — SODIUM CHLORIDE 0.9 % IV SOLN: 500.0000 mL | Freq: Once | INTRAVENOUS | Status: DC

## 2023-09-19 NOTE — Progress Notes (Signed)
 Called to room to assist during endoscopic procedure.  Patient ID and intended procedure confirmed with present staff. Received instructions for my participation in the procedure from the performing physician.

## 2023-09-19 NOTE — Patient Instructions (Signed)
-  Handout on post dilation diet provided -Continue present medications   YOU HAD AN ENDOSCOPIC PROCEDURE TODAY AT THE [facility]:   Refer to the procedure report that was given to you for any specific questions about what was found during the examination.  If the procedure report does not answer your questions, please call your gastroenterologist to clarify.  If you requested that your care partner not be given the details of your procedure findings, then the procedure report has been included in a sealed envelope for you to review at your convenience later.  YOU SHOULD EXPECT: Some feelings of bloating in the abdomen. Passage of more gas than usual.  Walking can help get rid of the air that was put into your GI tract during the procedure and reduce the bloating. If you had a lower endoscopy (such as a colonoscopy or flexible sigmoidoscopy) you may notice spotting of blood in your stool or on the toilet paper. If you underwent a bowel prep for your procedure, you may not have a normal bowel movement for a few days.  Please Note:  You might notice some irritation and congestion in your nose or some drainage.  This is from the oxygen used during your procedure.  There is no need for concern and it should clear up in a day or so.  SYMPTOMS TO REPORT IMMEDIATELY:  Following upper endoscopy (EGD)  Vomiting of blood or coffee ground material  New chest pain or pain under the shoulder blades  Painful or persistently difficult swallowing  New shortness of breath  Fever of 100F or higher  Black, tarry-looking stools  For urgent or emergent issues, a gastroenterologist can be reached at any hour by calling [phone]. Do not use MyChart messaging for urgent concerns.    DIET:  Nothing to eat or drink until 5:30 pm  Starting at 5:30 pm, Clear liquid diet for one hour, until 6:30 pm. Soft diet starting at 6:30 PM  until tomorrow. See post dilation diet handout for recommendations.  .   Drink plenty of fluids but you should avoid alcoholic beverages for 24 hours  ACTIVITY:  You should plan to take it easy for the rest of today and you should NOT DRIVE or use heavy machinery until tomorrow (because of the sedation medicines used during the test).    FOLLOW UP: Our staff will call the number listed on your records the next business day following your procedure.  We will call around 7:15- 8:00 am to check on you and address any questions or concerns that you may have regarding the information given to you following your procedure. If we do not reach you, we will leave a message.     If any biopsies were taken you will be contacted by phone or by letter within the next 1-3 weeks.  Please call us  at [phone] if you have not heard about the biopsies in 3 weeks.    SIGNATURES/CONFIDENTIALITY: You and/or your care partner have signed paperwork which will be entered into your electronic medical record.  These signatures attest to the fact that that the information above on your After Visit Summary has been reviewed and is understood.  Full responsibility of the confidentiality of this discharge information lies with you and/or your care-partner.

## 2023-09-19 NOTE — Op Note (Signed)
 [facility] Patient Name: Cody [name] Procedure Date: 09/19/2023 3:59 PM MRN: [id number] Endoscopist: Elspeth P. Leigh , MD, [id number] Age: 73 Referring MD:  Date of Birth: 10/09/1950 Gender: Male Account #: [id number] Procedure:                Upper GI endoscopy Indications:              Dysphagia, For therapy of esophageal stricture -                            EGD done on 08/22/23 - endoscope itself dilated the                            stricture. Biopsies taken and benign. Recommended                            omeprazole  at the last exam to help prevent                            recurrence. He reports last dilation helped but did                            not resolve symptoms. Medicines:                Monitored Anesthesia Care Procedure:                Pre-Anesthesia Assessment:                           - Prior to the procedure, a History and Physical                            was performed, and patient medications and                            allergies were reviewed. The patient's tolerance of                            previous anesthesia was also reviewed. The risks                            and benefits of the procedure and the sedation                            options and risks were discussed with the patient.                            All questions were answered, and informed consent                            was obtained. Prior Anticoagulants: The patient has                            taken no anticoagulant or antiplatelet agents. ASA  Grade Assessment: III - A patient with severe                            systemic disease. After reviewing the risks and                            benefits, the patient was deemed in satisfactory                            condition to undergo the procedure.                           After obtaining informed consent, the endoscope was                            passed under direct  vision. Throughout the                            procedure, the patient's blood pressure, pulse, and                            oxygen saturations were monitored continuously. The                            GIF [id number] #[id number] was introduced through the                            mouth, and advanced to the second part of duodenum.                            The upper GI endoscopy was accomplished without                            difficulty. The patient tolerated the procedure                            well. Scope In: Scope Out: Findings:                 Esophagogastric landmarks were identified: the                            Z-line was found at 37 cm, the gastroesophageal                            junction was found at 37 cm and the upper extent of                            the gastric folds was found at 39 cm from the                            incisors.                           A sliding 2-3  cm hiatal hernia was present.                           One benign-appearing, intrinsic moderate                            (circumferential scarring or stenosis; an endoscope                            may pass) stenosis was found. This stenosis                            measured less than one cm (in length). A TTS                            dilator was passed through the scope. Dilation with                            a 12-13.5-15 mm balloon dilator was performed to                            13.5 mm at which time appropriate dilation effect                            was noted.                           The exam of the esophagus was otherwise normal.                           The entire examined stomach was normal.                           The examined duodenum was normal. Of note, previous                            biopsy sites noted (a few small polyps were removed                            on the last exam and benign) Complications:            No immediate complications. Estimated  blood loss:                            Minimal. Estimated Blood Loss:     Estimated blood loss was minimal. Impression:               - Esophagogastric landmarks identified.                           - 2 cm hiatal hernia.                           - Benign-appearing esophageal stenosis. Dilated to  13.68mm with a good result.                           - Normal stomach.                           - Normal examined duodenum. Recommendation:           - Patient has a contact number available for                            emergencies. The signs and symptoms of potential                            delayed complications were discussed with the                            patient. Return to normal activities tomorrow.                            Written discharge instructions were provided to the                            patient.                           - Advance diet as tolerated. Liquid tonight                            initially, then soft as tolerated                           - Continue present medications.                           - Continue omeprazole                            - Repeat upper endoscopy as needed for retreatment                            if symptoms persist. Contact us  to schedule if you                            wish to have another dilation, if symptoms persist Mickael Mcnutt P. Coady Train, MD 09/19/2023 4:30:21 PM This report has been signed electronically.

## 2023-09-19 NOTE — Progress Notes (Signed)
 Vss nad trans to pacu

## 2023-09-19 NOTE — Progress Notes (Signed)
  Gastroenterology History and Physical   Primary Care Physician:  Pcp, No   Reason for Procedure:   Dysphagia, esophageal stricture  Plan:    EGD with dilation     HPI: Cody [name] is a 73 y.o. male  here for EGD with dilation of esophageal stricture at the [facility]. Seen on 12/12, dilated with the endoscope itself. Here for repeat exam with further dilation. States dilation on 12/12 has helped but did not resolve his dysphagia.    Otherwise feels well without any cardiopulmonary symptoms.   I have discussed risks / benefits of anesthesia and endoscopic procedure with Cody [name] and they wish to proceed with the exams as outlined today.    Past Medical History:  Diagnosis Date   Arthritis    Balance problem    Cataract    Depression    Diabetes mellitus without complication (HCC)    Diabetic neuropathy (HCC) 02/19/2019   Esophageal stricture    Glaucoma    Hiatal hernia    Hyperlipidemia    Hypertension    Numbness     Past Surgical History:  Procedure Laterality Date   COLONOSCOPY     CYST REMOVAL NECK     States he was young   FRACTURE SURGERY     Left foot    GANGLION CYST EXCISION Left    TONSILLECTOMY      Prior to Admission medications   Medication Sig Start Date End Date Taking? Authorizing Provider  atorvastatin  (LIPITOR) 10 MG tablet Take 1 tablet (10 mg total) by mouth daily. Patient not taking: Reported on 07/05/2023 10/17/18   Cody Suzen RAMAN, NP  B Complex-C (SUPER B COMPLEX PO) Take 1 capsule by mouth daily. Patient not taking: Reported on 07/05/2023    [provider]  busPIRone  (BUSPAR ) 15 MG tablet TAKE 1 TABLET (15 MG TOTAL) BY MOUTH 2 (TWO) TIMES DAILY. Patient not taking: Reported on 09/19/2023 11/20/18   Cody Suzen RAMAN, NP  carvedilol  (COREG ) 3.125 MG tablet Take 1 tablet (3.125 mg total) by mouth 2 (two) times daily with a meal. Patient not taking: Reported on 07/05/2023 10/17/18   Cody Suzen RAMAN, NP  escitalopram   (LEXAPRO ) 10 MG tablet Take 1 tablet (10 mg total) by mouth at bedtime. Patient not taking: Reported on 07/05/2023 10/17/18   Cody Suzen RAMAN, NP  lisinopril  (PRINIVIL ,ZESTRIL ) 40 MG tablet Take 1 tablet (40 mg total) by mouth daily. Patient not taking: Reported on 07/05/2023 10/17/18   Cody Suzen RAMAN, NP  metFORMIN  (GLUCOPHAGE ) 1000 MG tablet Take 1 tablet (1,000 mg total) by mouth daily with breakfast. Patient not taking: Reported on 07/05/2023 10/17/18   Cody Suzen RAMAN, NP  omeprazole  (PRILOSEC) 40 MG capsule Take 1 capsule (40 mg total) by mouth daily. Patient not taking: Reported on 09/19/2023 08/22/23   Cody Cody SQUIBB, MD  spironolactone  (ALDACTONE ) 25 MG tablet Take 1 tablet (25 mg total) by mouth daily. Patient not taking: Reported on 07/05/2023 10/17/18   Cody Suzen RAMAN, NP    Current Outpatient Medications  Medication Sig Dispense Refill   atorvastatin  (LIPITOR) 10 MG tablet Take 1 tablet (10 mg total) by mouth daily. (Patient not taking: Reported on 07/05/2023) 30 tablet 2   B Complex-C (SUPER B COMPLEX PO) Take 1 capsule by mouth daily. (Patient not taking: Reported on 07/05/2023)     busPIRone  (BUSPAR ) 15 MG tablet TAKE 1 TABLET (15 MG TOTAL) BY MOUTH 2 (TWO) TIMES DAILY. (Patient not taking: Reported on 09/19/2023) 60  tablet 6   carvedilol  (COREG ) 3.125 MG tablet Take 1 tablet (3.125 mg total) by mouth 2 (two) times daily with a meal. (Patient not taking: Reported on 07/05/2023) 60 tablet 2   escitalopram  (LEXAPRO ) 10 MG tablet Take 1 tablet (10 mg total) by mouth at bedtime. (Patient not taking: Reported on 07/05/2023) 60 tablet 0   lisinopril  (PRINIVIL ,ZESTRIL ) 40 MG tablet Take 1 tablet (40 mg total) by mouth daily. (Patient not taking: Reported on 07/05/2023) 30 tablet 2   metFORMIN  (GLUCOPHAGE ) 1000 MG tablet Take 1 tablet (1,000 mg total) by mouth daily with breakfast. (Patient not taking: Reported on 07/05/2023) 60 tablet 2   omeprazole  (PRILOSEC) 40 MG capsule Take  1 capsule (40 mg total) by mouth daily. (Patient not taking: Reported on 09/19/2023) 90 capsule 3   spironolactone  (ALDACTONE ) 25 MG tablet Take 1 tablet (25 mg total) by mouth daily. (Patient not taking: Reported on 07/05/2023) 90 tablet 1   Current Facility-Administered Medications  Medication Dose Route Frequency Provider Last Rate Last Admin   0.9 %  sodium chloride  infusion  500 mL Intravenous Once Cody [name], Cody SQUIBB, MD        Allergies as of 09/19/2023   (No Known Allergies)    Family History  Problem Relation Age of Onset   Obesity Mother    Heart failure Father    Diabetes Brother    Hypertension Brother    Colon cancer Neg Hx    Esophageal cancer Neg Hx    Pancreatic cancer Neg Hx    Stomach cancer Neg Hx    Rectal cancer Neg Hx    Colon polyps Neg Hx     Social History   Socioeconomic History   Marital status: Single    Spouse name: Not on file   Number of children: 0   Years of education: Not on file   Highest education level: 12th grade  Occupational History   Occupation: Retired   Tobacco Use   Smoking status: Never   Smokeless tobacco: Current    Types: Designer, Multimedia Use   Vaping status: Never Used  Substance and Sexual Activity   Alcohol use: Not Currently   Drug use: Never   Sexual activity: Not on file  Other Topics Concern   Not on file  Social History Narrative   Right handed    Caffeine 1-2 cups daily    Lives at home with brother Cody [name]    Social Drivers of Corporate Investment Banker Strain: Not on file  Food Insecurity: Not on file  Transportation Needs: Not on file  Physical Activity: Not on file  Stress: Not on file  Social Connections: Not on file  Intimate Partner Violence: Not on file    Review of Systems: All other review of systems negative except as mentioned in the HPI.  Physical Exam: Vital signs BP (!) 122/95   Pulse 94   Temp (!) 97 F (36.1 C) (Temporal)   Ht 5' 6 (1.676 m)   Wt 138 lb (62.6 kg)   SpO2 99%    BMI 22.27 kg/m   General:   Alert,  Well-developed, pleasant and cooperative in NAD Lungs:  Clear throughout to auscultation.   Heart:  Regular rate and rhythm Abdomen:  Soft, nontender and nondistended.   Neuro/Psych:  Alert and cooperative. Normal mood and affect. A and O x 3  Marcey Naval, MD [facility]

## 2023-09-20 ENCOUNTER — Telehealth: Payer: Self-pay | Admitting: *Deleted

## 2023-09-20 NOTE — Telephone Encounter (Signed)
 Post procedure follow up call placed, no answer and left VM.

## 2023-10-04 ENCOUNTER — Ambulatory Visit: Payer: Medicare (Managed Care) | Admitting: Physician Assistant

## 2024-02-06 ENCOUNTER — Emergency Department (HOSPITAL_COMMUNITY): Payer: Medicare (Managed Care)

## 2024-02-06 ENCOUNTER — Encounter (HOSPITAL_COMMUNITY): Payer: Self-pay

## 2024-02-06 ENCOUNTER — Other Ambulatory Visit: Payer: Self-pay

## 2024-02-06 ENCOUNTER — Inpatient Hospital Stay (HOSPITAL_COMMUNITY)
Admission: EM | Admit: 2024-02-06 | Discharge: 2024-02-11 | DRG: 286 | Disposition: A | Discharge status: Home / Self Care | Payer: Medicare (Managed Care) | Attending: Internal Medicine | Admitting: Internal Medicine

## 2024-02-06 DIAGNOSIS — Z79899 Other long term (current) drug therapy: Secondary | ICD-10-CM

## 2024-02-06 DIAGNOSIS — R0600 Dyspnea, unspecified: Secondary | ICD-10-CM | POA: Diagnosis not present

## 2024-02-06 DIAGNOSIS — H409 Unspecified glaucoma: Secondary | ICD-10-CM | POA: Diagnosis present

## 2024-02-06 DIAGNOSIS — E785 Hyperlipidemia, unspecified: Secondary | ICD-10-CM | POA: Diagnosis present

## 2024-02-06 DIAGNOSIS — I10 Essential (primary) hypertension: Secondary | ICD-10-CM

## 2024-02-06 DIAGNOSIS — N179 Acute kidney failure, unspecified: Secondary | ICD-10-CM

## 2024-02-06 DIAGNOSIS — Z833 Family history of diabetes mellitus: Secondary | ICD-10-CM

## 2024-02-06 DIAGNOSIS — G3184 Mild cognitive impairment, so stated: Secondary | ICD-10-CM | POA: Diagnosis present

## 2024-02-06 DIAGNOSIS — Z8249 Family history of ischemic heart disease and other diseases of the circulatory system: Secondary | ICD-10-CM | POA: Diagnosis not present

## 2024-02-06 DIAGNOSIS — Z7982 Long term (current) use of aspirin: Secondary | ICD-10-CM | POA: Diagnosis not present

## 2024-02-06 DIAGNOSIS — F32A Depression, unspecified: Secondary | ICD-10-CM | POA: Diagnosis present

## 2024-02-06 DIAGNOSIS — I428 Other cardiomyopathies: Secondary | ICD-10-CM | POA: Diagnosis present

## 2024-02-06 DIAGNOSIS — I5041 Acute combined systolic (congestive) and diastolic (congestive) heart failure: Secondary | ICD-10-CM

## 2024-02-06 DIAGNOSIS — R7989 Other specified abnormal findings of blood chemistry: Secondary | ICD-10-CM | POA: Diagnosis present

## 2024-02-06 DIAGNOSIS — Z91128 Patient's intentional underdosing of medication regimen for other reason: Secondary | ICD-10-CM

## 2024-02-06 DIAGNOSIS — Z7984 Long term (current) use of oral hypoglycemic drugs: Secondary | ICD-10-CM

## 2024-02-06 DIAGNOSIS — Z720 Tobacco use: Secondary | ICD-10-CM | POA: Diagnosis not present

## 2024-02-06 DIAGNOSIS — I509 Heart failure, unspecified: Secondary | ICD-10-CM | POA: Diagnosis not present

## 2024-02-06 DIAGNOSIS — I340 Nonrheumatic mitral (valve) insufficiency: Secondary | ICD-10-CM | POA: Diagnosis present

## 2024-02-06 DIAGNOSIS — E1140 Type 2 diabetes mellitus with diabetic neuropathy, unspecified: Secondary | ICD-10-CM | POA: Diagnosis present

## 2024-02-06 DIAGNOSIS — I5021 Acute systolic (congestive) heart failure: Secondary | ICD-10-CM

## 2024-02-06 DIAGNOSIS — N1831 Chronic kidney disease, stage 3a: Secondary | ICD-10-CM | POA: Diagnosis present

## 2024-02-06 DIAGNOSIS — Z66 Do not resuscitate: Secondary | ICD-10-CM | POA: Diagnosis present

## 2024-02-06 DIAGNOSIS — I2510 Atherosclerotic heart disease of native coronary artery without angina pectoris: Secondary | ICD-10-CM

## 2024-02-06 DIAGNOSIS — E1122 Type 2 diabetes mellitus with diabetic chronic kidney disease: Secondary | ICD-10-CM | POA: Diagnosis present

## 2024-02-06 DIAGNOSIS — I429 Cardiomyopathy, unspecified: Secondary | ICD-10-CM

## 2024-02-06 DIAGNOSIS — E1159 Type 2 diabetes mellitus with other circulatory complications: Secondary | ICD-10-CM

## 2024-02-06 DIAGNOSIS — I2489 Other forms of acute ischemic heart disease: Secondary | ICD-10-CM | POA: Diagnosis present

## 2024-02-06 DIAGNOSIS — E1165 Type 2 diabetes mellitus with hyperglycemia: Secondary | ICD-10-CM | POA: Diagnosis not present

## 2024-02-06 DIAGNOSIS — R0609 Other forms of dyspnea: Secondary | ICD-10-CM | POA: Diagnosis not present

## 2024-02-06 DIAGNOSIS — I130 Hypertensive heart and chronic kidney disease with heart failure and stage 1 through stage 4 chronic kidney disease, or unspecified chronic kidney disease: Principal | ICD-10-CM | POA: Diagnosis present

## 2024-02-06 LAB — CBC
HCT: 42.9 % (ref 39.0–52.0)
Hemoglobin: 13.7 g/dL (ref 13.0–17.0)
MCH: 29.9 pg (ref 26.0–34.0)
MCHC: 31.9 g/dL (ref 30.0–36.0)
MCV: 93.7 fL (ref 80.0–100.0)
Platelets: 280 10*3/uL (ref 150–400)
RBC: 4.58 MIL/uL (ref 4.22–5.81)
RDW: 13.9 % (ref 11.5–15.5)
WBC: 8 10*3/uL (ref 4.0–10.5)
nRBC: 0 % (ref 0.0–0.2)

## 2024-02-06 LAB — BASIC METABOLIC PANEL WITH GFR
Anion gap: 12 (ref 5–15)
BUN: 28 mg/dL — ABNORMAL HIGH (ref 8–23)
CO2: 20 mmol/L — ABNORMAL LOW (ref 22–32)
Calcium: 9.4 mg/dL (ref 8.9–10.3)
Chloride: 107 mmol/L (ref 98–111)
Creatinine, Ser: 1.34 mg/dL — ABNORMAL HIGH (ref 0.61–1.24)
GFR, Estimated: 56 mL/min — ABNORMAL LOW (ref >60)
Glucose, Bld: 144 mg/dL — ABNORMAL HIGH (ref 70–99)
Potassium: 4.3 mmol/L (ref 3.5–5.1)
Sodium: 139 mmol/L (ref 135–145)

## 2024-02-06 LAB — BRAIN NATRIURETIC PEPTIDE: B Natriuretic Peptide: 1044.3 pg/mL — ABNORMAL HIGH (ref 0.0–100.0)

## 2024-02-06 LAB — SARS CORONAVIRUS 2 BY RT PCR: SARS Coronavirus 2 by RT PCR: NEGATIVE

## 2024-02-06 LAB — TROPONIN I (HIGH SENSITIVITY)
Troponin I (High Sensitivity): 81 ng/L — ABNORMAL HIGH (ref <18)
Troponin I (High Sensitivity): 95 ng/L — ABNORMAL HIGH (ref <18)

## 2024-02-06 MED ORDER — ENOXAPARIN SODIUM 40 MG/0.4ML IJ SOSY
40.0000 mg | PREFILLED_SYRINGE | INTRAMUSCULAR | Status: DC
  Administered 2024-02-06 – 2024-02-10 (×5): 40 mg via SUBCUTANEOUS
  Filled 2024-02-06 (×5): qty 0.4

## 2024-02-06 MED ORDER — ACETAMINOPHEN 650 MG RE SUPP: 650.0000 mg | Freq: Four times a day (QID) | RECTAL | Status: DC | PRN

## 2024-02-06 MED ORDER — IOHEXOL 350 MG/ML SOLN
65.0000 mL | Freq: Once | INTRAVENOUS | Status: AC | PRN
  Administered 2024-02-06: 65 mL via INTRAVENOUS

## 2024-02-06 MED ORDER — SENNOSIDES-DOCUSATE SODIUM 8.6-50 MG PO TABS: 1.0000 | ORAL_TABLET | Freq: Every evening | ORAL | Status: DC | PRN

## 2024-02-06 MED ORDER — FUROSEMIDE 10 MG/ML IJ SOLN
40.0000 mg | Freq: Once | INTRAMUSCULAR | Status: AC
  Administered 2024-02-06: 40 mg via INTRAVENOUS
  Filled 2024-02-06: qty 4

## 2024-02-06 MED ORDER — ONDANSETRON HCL 4 MG PO TABS: 4.0000 mg | ORAL_TABLET | Freq: Four times a day (QID) | ORAL | Status: DC | PRN

## 2024-02-06 MED ORDER — ACETAMINOPHEN 325 MG PO TABS: 650.0000 mg | ORAL_TABLET | Freq: Four times a day (QID) | ORAL | Status: DC | PRN

## 2024-02-06 MED ORDER — ONDANSETRON HCL 4 MG/2ML IJ SOLN
4.0000 mg | Freq: Four times a day (QID) | INTRAMUSCULAR | Status: DC | PRN
Start: 2024-02-06 — End: 2024-02-11

## 2024-02-06 NOTE — ED Provider Notes (Signed)
 Overall lab work shows elevation of troponin to 81 and 95.  Chest x-ray was clear.  No obvious signs of major swelling in the legs.  I was concerned for maybe PE got a CT scan of the chest that showed no evidence of PE.  However BNP came back about thousand.  My concern is for maybe new heart failure.  He did have some small bilateral pleural effusions.  Gave him a dose of IV Lasix.  Think he needs further cardiac workup echocardiogram and admission.  Patient admitted to hospital service for further care.  He has been having shortness of breath with exertion for the last week.   Lowery Rue, DO 02/06/24 2137

## 2024-02-06 NOTE — Progress Notes (Signed)
ED staff able to establish PIV access.

## 2024-02-06 NOTE — ED Notes (Signed)
 Extra SST, DG and Blue top drawn

## 2024-02-06 NOTE — ED Triage Notes (Signed)
 Pt bib ems from [facility] for intermittent SOB and an EKG showing a RBBB, unknown if pt has hx of same. Pt denies chest pain. Hx of dementia but pt a.ox4. resp e/u at this time.

## 2024-02-06 NOTE — H&P (Signed)
 History and Physical  Cody [name] XBJ:[id number] DOB: 1950-12-19 DOA: 02/06/2024  PCP: Pcp, No   Chief Complaint: Shortness of breath  HPI: Cody [name] is a 73 y.o. male with medical history significant for HTN, HLD, T2DM, diabetic neuropathy, depression and memory impairment who presents [facility] for evaluation of intermittent shortness of breath.  ED Course: Initial vitals stable with SBP 100s to 130s, SpO2 100% on room air.  Initial labs significant for BUN/creatinine 28/1.34, BNP 1044, troponin 81->95 and normal CBC.  EKG shows sinus rhythm with RBBB. CXR with no acute cardiopulmonary disease.  CTA chest PE study negative for PE but shows small bilateral pleural effusions, right greater than left.  Patient received IV Lasix 40 mg x 1.  TR[facility] was consulted for admission.  Review of Systems: Please see HPI for pertinent positives and negatives. A complete 10 system review of systems are otherwise negative.  Past Medical History:  Diagnosis Date   Arthritis    Balance problem    Cataract    Depression    Diabetes mellitus without complication (HCC)    Diabetic neuropathy (HCC) 02/19/2019   Esophageal stricture    Glaucoma    Hiatal hernia    Hyperlipidemia    Hypertension    Numbness    Past Surgical History:  Procedure Laterality Date   COLONOSCOPY     CYST REMOVAL NECK     States he was young   FRACTURE SURGERY     Left foot    GANGLION CYST EXCISION Left    TONSILLECTOMY     Social History:  reports that he has never smoked. His smokeless tobacco use includes chew. He reports that he does not currently use alcohol. He reports that he does not use drugs.  No Known Allergies  Family History  Problem Relation Age of Onset   Obesity Mother    Heart failure Father    Diabetes Brother    Hypertension Brother    Colon cancer Neg Hx    Esophageal cancer Neg Hx    Pancreatic cancer Neg Hx    Stomach cancer Neg Hx    Rectal cancer Neg Hx    Colon polyps Neg  Hx      Prior to Admission medications   Medication Sig Start Date End Date Taking? Authorizing Provider  atorvastatin  (LIPITOR) 10 MG tablet Take 1 tablet (10 mg total) by mouth daily. Patient not taking: Reported on 02/06/2024 10/17/18   Buena Carmine, NP  busPIRone  (BUSPAR ) 15 MG tablet TAKE 1 TABLET (15 MG TOTAL) BY MOUTH 2 (TWO) TIMES DAILY. Patient not taking: Reported on 08/22/2023 11/20/18   Buena Carmine, NP  carvedilol  (COREG ) 3.125 MG tablet Take 1 tablet (3.125 mg total) by mouth 2 (two) times daily with a meal. Patient not taking: Reported on 07/05/2023 10/17/18   Buena Carmine, NP  escitalopram  (LEXAPRO ) 10 MG tablet Take 1 tablet (10 mg total) by mouth at bedtime. Patient not taking: Reported on 07/05/2023 10/17/18   Buena Carmine, NP  lisinopril  (PRINIVIL ,ZESTRIL ) 40 MG tablet Take 1 tablet (40 mg total) by mouth daily. Patient not taking: Reported on 07/05/2023 10/17/18   Buena Carmine, NP  metFORMIN  (GLUCOPHAGE ) 1000 MG tablet Take 1 tablet (1,000 mg total) by mouth daily with breakfast. Patient not taking: Reported on 07/05/2023 10/17/18   Buena Carmine, NP  omeprazole  (PRILOSEC) 40 MG capsule Take 1 capsule (40 mg total) by mouth daily. Patient not taking: Reported  on 09/19/2023 08/22/23   Ace Holder, MD  spironolactone  (ALDACTONE ) 25 MG tablet Take 1 tablet (25 mg total) by mouth daily. Patient not taking: Reported on 07/05/2023 10/17/18   Buena Carmine, NP    Physical Exam: BP 133/87   Pulse (!) 101   Temp 97.8 F (36.6 C) (Oral)   Resp 17   Ht 5\' 6"  (1.676 m)   Wt 64.4 kg   SpO2 100%   BMI 22.92 kg/m  General: Pleasant, well-appearing *** laying in bed. No acute distress. HEENT: [place]/AT. Anicteric sclera CV: RRR. No murmurs, rubs, or gallops. No LE edema Pulmonary: Lungs CTAB. Normal effort. No wheezing or rales. Abdominal: Soft, nontender, nondistended. Normal bowel sounds. Extremities: Palpable radial and DP pulses. Normal  ROM. Skin: Warm and dry. No obvious rash or lesions. Neuro: A&Ox3. Moves all extremities. Normal sensation to light touch. No focal deficit. Psych: Normal mood and affect          Labs on Admission:  Basic Metabolic Panel: Recent Labs  Lab 02/06/24 1545  NA 139  K 4.3  CL 107  CO2 20*  GLUCOSE 144*  BUN 28*  CREATININE 1.34*  CALCIUM  9.4   Liver Function Tests: No results for input(s): "AST", "ALT", "ALKPHOS", "BILITOT", "PROT", "ALBUMIN" in the last 168 hours. No results for input(s): "LIPASE", "AMYLASE" in the last 168 hours. No results for input(s): "AMMONIA" in the last 168 hours. CBC: Recent Labs  Lab 02/06/24 1545  WBC 8.0  HGB 13.7  HCT 42.9  MCV 93.7  PLT 280   Cardiac Enzymes: No results for input(s): "CKTOTAL", "CKMB", "CKMBINDEX", "TROPONINI" in the last 168 hours. BNP (last 3 results) Recent Labs    02/06/24 1545  BNP 1,044.3*    ProBNP (last 3 results) No results for input(s): "PROBNP" in the last 8760 hours.  CBG: No results for input(s): "GLUCAP" in the last 168 hours.  Radiological Exams on Admission: CT Angio Chest PE W and/or Wo Contrast Result Date: 02/06/2024 CLINICAL DATA:  Dyspnea. EXAM: CT ANGIOGRAPHY CHEST WITH CONTRAST TECHNIQUE: Multidetector CT imaging of the chest was performed using the standard protocol during bolus administration of intravenous contrast. Multiplanar CT image reconstructions and MIPs were obtained to evaluate the vascular anatomy. RADIATION DOSE REDUCTION: This exam was performed according to the departmental dose-optimization program which includes automated exposure control, adjustment of the mA and/or kV according to patient size and/or use of iterative reconstruction technique. CONTRAST:  65mL OMNIPAQUE IOHEXOL 350 MG/ML SOLN COMPARISON:  None Available. FINDINGS: Cardiovascular: There is marked severity calcification of the thoracic aorta without evidence of aortic aneurysm. Satisfactory opacification of the  pulmonary arteries to the segmental level. No evidence of pulmonary embolism. Normal heart size with mild coronary artery calcification. No pericardial effusion. Mediastinum/Nodes: No enlarged mediastinal, hilar, or axillary lymph nodes. The thyroid gland and trachea demonstrate no significant findings. Mild diffuse esophageal wall thickening is seen. Lungs/Pleura: Mild posterior right apical and anteromedial right upper lobe linear scarring and/or atelectasis is seen. Small bilateral pleural effusions are noted, right slightly greater than left. No pneumothorax is identified. Upper Abdomen: There is a small to moderate sized hiatal hernia. Musculoskeletal: Multilevel degenerative changes seen throughout the thoracic spine. Review of the MIP images confirms the above findings. IMPRESSION: 1. No evidence of pulmonary embolism. 2. Small bilateral pleural effusions, right slightly greater than left. 3. Mild posterior right apical and anteromedial right upper lobe linear scarring and/or atelectasis. 4. Small to moderate sized hiatal hernia. 5. Mild  diffuse esophageal wall thickening which may represent esophagitis. 6. Aortic atherosclerosis. Electronically Signed   By: Virgle Grime M.D.   On: 02/06/2024 21:14   DG Chest 2 View Result Date: 02/06/2024 CLINICAL DATA:  Shortness of breath EXAM: CHEST - 2 VIEW COMPARISON:  None Available. FINDINGS: The heart size and mediastinal contours are within normal limits. Both lungs are clear. The visualized skeletal structures notable for degenerative changes. Minimal blunting of the costophrenic angles which may represent chronic scarring. IMPRESSION: 1. No definite plain film evidence of acute process. Electronically Signed   By: Reagan Camera M.D.   On: 02/06/2024 14:46   Assessment/Plan Cody [name] is a 73 y.o. male with medical history significant for HTN, HLD, T2DM, diabetic neuropathy, depression and memory impairment who presents [facility] for  evaluation of intermittent shortness of breath.   # New onset heart failure  # T2DM  # HTN  # Diabetic neuropathy  # Memory impairment  #***  #***  DVT prophylaxis: Lovenox     Code Status: Limited: Do not attempt resuscitation (DNR) -DNR-LIMITED -Do Not Intubate/DNI   Consults called: None  Family Communication: No family at bedside  Severity of Illness: The appropriate patient status for this patient is INPATIENT. Inpatient status is judged to be reasonable and necessary in order to provide the required intensity of service to ensure the patient's safety. The patient's presenting symptoms, physical exam findings, and initial radiographic and laboratory data in the context of their chronic comorbidities is felt to place them at high risk for further clinical deterioration. Furthermore, it is not anticipated that the patient will be medically stable for discharge from the hospital within 2 midnights of admission.   * I certify that at the point of admission it is my clinical judgment that the patient will require inpatient hospital care spanning beyond 2 midnights from the point of admission due to high intensity of service, high risk for further deterioration and high frequency of surveillance required.*  Level of care: Telemetry Medical   This record has been created using Conservation officer, historic buildings. Errors have been sought and corrected, but may not always be located. Such creation errors do not reflect on the standard of care.   Vita Grip, MD 02/06/2024, 10:40 PM [facility] Pager: [phone] Isaiah 41:10   If 7PM-7AM, please contact night-coverage [email or web address] Password [email or web address]

## 2024-02-06 NOTE — ED Notes (Signed)
 CCMD called for cardiac monitoring @22 :30

## 2024-02-06 NOTE — ED Notes (Signed)
 Pt refused nasal swab at this time. MD Ray notified.

## 2024-02-06 NOTE — ED Notes (Signed)
 Pt refusing blood draw at this time. MD Ray notified.

## 2024-02-06 NOTE — ED Provider Notes (Signed)
 [facility] AT [facility] Provider Note   CSN: [id number] Arrival date & time: 02/06/24  1236     History  Chief Complaint  Patient presents with   Shortness of Breath    Cody [name] is a 73 y.o. male.  HPI 72 year old male history of diabetes, impairment, hypertension, diabetic neuropathy, [facility] for intermittent shortness of breath.  Patient states he has had dyspnea for the past several weeks.  He denies any cough, fever, chest pain, history of DVT or PE.  He states he feels improved here in the ED.  Denies chest pain, denies leg edema, denies nausea vomiting diarrhea or abdominal pain.  Patient states he lives alone and gets around with a walker    Home Medications Prior to Admission medications   Medication Sig Start Date End Date Taking? Authorizing Provider  atorvastatin  (LIPITOR) 10 MG tablet Take 1 tablet (10 mg total) by mouth daily. Patient not taking: Reported on 07/05/2023 10/17/18   Buena Carmine, NP  B Complex-C (SUPER B COMPLEX PO) Take 1 capsule by mouth daily. Patient not taking: Reported on 07/05/2023    [provider]  busPIRone  (BUSPAR ) 15 MG tablet TAKE 1 TABLET (15 MG TOTAL) BY MOUTH 2 (TWO) TIMES DAILY. Patient not taking: Reported on 09/19/2023 11/20/18   Buena Carmine, NP  carvedilol  (COREG ) 3.125 MG tablet Take 1 tablet (3.125 mg total) by mouth 2 (two) times daily with a meal. Patient not taking: Reported on 07/05/2023 10/17/18   Buena Carmine, NP  escitalopram  (LEXAPRO ) 10 MG tablet Take 1 tablet (10 mg total) by mouth at bedtime. Patient not taking: Reported on 07/05/2023 10/17/18   Buena Carmine, NP  lisinopril  (PRINIVIL ,ZESTRIL ) 40 MG tablet Take 1 tablet (40 mg total) by mouth daily. Patient not taking: Reported on 07/05/2023 10/17/18   Buena Carmine, NP  metFORMIN  (GLUCOPHAGE ) 1000 MG tablet Take 1 tablet (1,000 mg total) by mouth daily with breakfast. Patient  not taking: Reported on 07/05/2023 10/17/18   Buena Carmine, NP  omeprazole  (PRILOSEC) 40 MG capsule Take 1 capsule (40 mg total) by mouth daily. Patient not taking: Reported on 09/19/2023 08/22/23   Ace Holder, MD  spironolactone  (ALDACTONE ) 25 MG tablet Take 1 tablet (25 mg total) by mouth daily. Patient not taking: Reported on 07/05/2023 10/17/18   Buena Carmine, NP      Allergies    Patient has no known allergies.    Review of Systems   Review of Systems  Physical Exam Updated Vital Signs BP 103/76   Pulse 83   Temp 97.9 F (36.6 C) (Oral)   Resp 20   Ht 1.676 m (5\' 6" )   Wt 64.4 kg   SpO2 100%   BMI 22.92 kg/m  Physical Exam Vitals reviewed.  Constitutional:      Appearance: He is well-developed.  HENT:     Head: Normocephalic.     Mouth/Throat:     Mouth: Mucous membranes are moist.  Eyes:     Pupils: Pupils are equal, round, and reactive to light.  Cardiovascular:     Rate and Rhythm: Normal rate and regular rhythm.  Pulmonary:     Effort: Pulmonary effort is normal.     Breath sounds: Normal breath sounds.  Abdominal:     Palpations: Abdomen is soft.  Musculoskeletal:        General: Normal range of motion.     Cervical  back: Normal range of motion and neck supple.     Right lower leg: No tenderness. No edema.     Left lower leg: No tenderness. No edema.  Skin:    General: Skin is warm.     Capillary Refill: Capillary refill takes less than 2 seconds.  Neurological:     General: No focal deficit present.     Mental Status: He is alert.  Psychiatric:        Mood and Affect: Mood normal.    ED Results / Procedures / Treatments   Labs (all labs ordered are listed, but only abnormal results are displayed) Labs Reviewed  BASIC METABOLIC PANEL WITH GFR  CBC    EKG None  Radiology No results found.  Procedures Procedures    Medications Ordered in ED Medications - No data to display  ED Course/ Medical Decision Making/ A&P                                  Medical Decision Making Amount and/or Complexity of Data Reviewed Labs: ordered. Radiology: ordered.  73 year old male history of hypertension, diabetes, presents today with reports of intermittent shortness of breath and EKG with right bundle branch block. Patient is hemodynamically stable here afebrile, normal blood pressure, normal heart rate, normal respiratory rate, oxygen saturation 96% Differential diagnosis includes but is not limited to primary pulmonary etiology including infection, pneumothorax, cardiac etiology leading ischemia, volume overload, CHF, anemia, other metabolic etiologies Patient without complaints of chest pain, cough, or fever.  He denies any history of DVT or PE.  He denies any peripheral edema, swelling. Patient evaluated here with EKG with right bundle branch block and nonspecific ST changes without acute ST elevation.  There is no old EKG to compare. CBC, basic metabolic panel, chest x-Felicia Bloomquist, COVID, flu ordered. These are currently pending Patient care signed out to Dr. Colonel Dears who will evaluate after results of testing        Final Clinical Impression(s) / ED Diagnoses Final diagnoses:  None    Rx / DC Orders ED Discharge Orders     None         Auston Blush, MD 02/06/24 1610

## 2024-02-06 NOTE — ED Notes (Signed)
 Patient transported to X-ray

## 2024-02-07 ENCOUNTER — Inpatient Hospital Stay (HOSPITAL_COMMUNITY): Payer: Medicare (Managed Care)

## 2024-02-07 DIAGNOSIS — I5021 Acute systolic (congestive) heart failure: Secondary | ICD-10-CM | POA: Diagnosis not present

## 2024-02-07 DIAGNOSIS — R0600 Dyspnea, unspecified: Secondary | ICD-10-CM

## 2024-02-07 DIAGNOSIS — I509 Heart failure, unspecified: Secondary | ICD-10-CM | POA: Diagnosis not present

## 2024-02-07 DIAGNOSIS — N179 Acute kidney failure, unspecified: Secondary | ICD-10-CM

## 2024-02-07 DIAGNOSIS — R7989 Other specified abnormal findings of blood chemistry: Secondary | ICD-10-CM

## 2024-02-07 DIAGNOSIS — R0609 Other forms of dyspnea: Secondary | ICD-10-CM

## 2024-02-07 LAB — ECHOCARDIOGRAM COMPLETE
AR max vel: 1.34 cm2
AV Area VTI: 1.66 cm2
AV Area mean vel: 1.27 cm2
AV Mean grad: 2 mmHg
AV Peak grad: 3.2 mmHg
Ao pk vel: 0.9 m/s
Area-P 1/2: 6.07 cm2
Calc EF: 40.2 %
Height: 66 in
MV M vel: 4.36 m/s
MV Peak grad: 75.9 mmHg
MV VTI: 1.32 cm2
Radius: 0.5 cm
S' Lateral: 4.4 cm
Single Plane A2C EF: 32.5 %
Single Plane A4C EF: 44.6 %
Weight: 2272 [oz_av]

## 2024-02-07 LAB — LIPID PANEL
Cholesterol: 93 mg/dL (ref 0–200)
HDL: 38 mg/dL — ABNORMAL LOW (ref >40)
LDL Cholesterol: 20 mg/dL (ref 0–99)
Total CHOL/HDL Ratio: 2.4 ratio
Triglycerides: 175 mg/dL — ABNORMAL HIGH (ref <150)
VLDL: 35 mg/dL (ref 0–40)

## 2024-02-07 LAB — CBC
HCT: 42.9 % (ref 39.0–52.0)
Hemoglobin: 14.4 g/dL (ref 13.0–17.0)
MCH: 30.3 pg (ref 26.0–34.0)
MCHC: 33.6 g/dL (ref 30.0–36.0)
MCV: 90.1 fL (ref 80.0–100.0)
Platelets: 315 10*3/uL (ref 150–400)
RBC: 4.76 MIL/uL (ref 4.22–5.81)
RDW: 13.8 % (ref 11.5–15.5)
WBC: 9.2 10*3/uL (ref 4.0–10.5)
nRBC: 0 % (ref 0.0–0.2)

## 2024-02-07 LAB — BASIC METABOLIC PANEL WITH GFR
Anion gap: 13 (ref 5–15)
BUN: 28 mg/dL — ABNORMAL HIGH (ref 8–23)
CO2: 22 mmol/L (ref 22–32)
Calcium: 9.7 mg/dL (ref 8.9–10.3)
Chloride: 105 mmol/L (ref 98–111)
Creatinine, Ser: 1.46 mg/dL — ABNORMAL HIGH (ref 0.61–1.24)
GFR, Estimated: 50 mL/min — ABNORMAL LOW (ref >60)
Glucose, Bld: 124 mg/dL — ABNORMAL HIGH (ref 70–99)
Potassium: 4.3 mmol/L (ref 3.5–5.1)
Sodium: 140 mmol/L (ref 135–145)

## 2024-02-07 LAB — CBG MONITORING, ED
Glucose-Capillary: 212 mg/dL — ABNORMAL HIGH (ref 70–99)
Glucose-Capillary: 112 mg/dL — ABNORMAL HIGH (ref 70–99)

## 2024-02-07 LAB — TROPONIN I (HIGH SENSITIVITY)
Troponin I (High Sensitivity): 110 ng/L (ref <18)
Troponin I (High Sensitivity): 103 ng/L (ref <18)

## 2024-02-07 LAB — GLUCOSE, CAPILLARY
Glucose-Capillary: 129 mg/dL — ABNORMAL HIGH (ref 70–99)
Glucose-Capillary: 110 mg/dL — ABNORMAL HIGH (ref 70–99)

## 2024-02-07 MED ORDER — FUROSEMIDE 10 MG/ML IJ SOLN
40.0000 mg | Freq: Every day | INTRAMUSCULAR | Status: DC
  Administered 2024-02-08: 40 mg via INTRAVENOUS
  Filled 2024-02-07 (×2): qty 4

## 2024-02-07 MED ORDER — ROSUVASTATIN CALCIUM 20 MG PO TABS
40.0000 mg | ORAL_TABLET | Freq: Every day | ORAL | Status: DC
  Administered 2024-02-07 – 2024-02-11 (×5): 40 mg via ORAL
  Filled 2024-02-07 (×5): qty 2

## 2024-02-07 MED ORDER — SPIRONOLACTONE 12.5 MG HALF TABLET
12.5000 mg | ORAL_TABLET | Freq: Every day | ORAL | Status: DC
  Administered 2024-02-07 – 2024-02-08 (×2): 12.5 mg via ORAL
  Filled 2024-02-07 (×2): qty 1

## 2024-02-07 MED ORDER — EMPAGLIFLOZIN 10 MG PO TABS
10.0000 mg | ORAL_TABLET | Freq: Every day | ORAL | Status: DC
  Administered 2024-02-07 – 2024-02-11 (×4): 10 mg via ORAL
  Filled 2024-02-07 (×5): qty 1

## 2024-02-07 MED ORDER — INSULIN ASPART 100 UNIT/ML IJ SOLN
0.0000 [IU] | Freq: Three times a day (TID) | INTRAMUSCULAR | Status: DC
  Administered 2024-02-07: 2 [IU] via SUBCUTANEOUS
  Administered 2024-02-07: 5 [IU] via SUBCUTANEOUS
  Administered 2024-02-08 (×2): 2 [IU] via SUBCUTANEOUS
  Administered 2024-02-09: 3 [IU] via SUBCUTANEOUS
  Administered 2024-02-09: 2 [IU] via SUBCUTANEOUS

## 2024-02-07 NOTE — Plan of Care (Signed)
   Problem: Education: Goal: Ability to describe self-care measures that may prevent or decrease complications (Diabetes Survival Skills Education) will improve Outcome: Progressing

## 2024-02-07 NOTE — Consult Note (Addendum)
 As below, patient seen and examined.  Briefly he is a 73 year old male with past medical history of diabetes mellitus for 5 years, hypertension, hyperlipidemia, esophageal stricture, memory impairment for evaluation of acute systolic congestive heart failure.  Patient previously resided in [place].  He apparently had some heart issues in the past but does not recall what issues those were.  I have no records available.  He states that over the past 5 days he has had progressive dyspnea on exertion.  No orthopnea, PND, pedal edema, chest pain or syncope.  Patient does state that 2 months ago he stopped taking his medications.  His electrocardiogram shows sinus rhythm with right bundle branch block.  Creatinine 1.46, potassium 4.3, hemoglobin 14.4, BNP 1044, troponin 95, 110 and 103.  Preliminary review of echocardiogram shows severe LV dysfunction with inferolateral akinesis and moderate to severe mitral regurgitation.  1 acute systolic congestive heart failure-patient is not markedly volume overloaded on examination but does complain of dyspnea on exertion.  He has noted to have pleural effusions on his CTA.  Will treat with Lasix 40 mg IV daily, spironolactone  12.5 mg daily, Jardiance 10 mg daily.  Will add losartan 12.5 mg daily later if BP allows and transition to entresto.  Will begin beta-blockade when CHF improves.  His echocardiogram shows severe LV dysfunction with inferolateral akinesis.  There was also moderate to severe mitral regurgitation.  Final report is pending.  He will require right and left cardiac catheterization early next week.  The risk and benefits including myocardial infarction, CVA and death discussed and he agrees to proceed.  Cardiomyopathy appears likely to be ischemic.  Will add aspirin and statin as well.  2 chronic stage IIIa kidney disease-follow renal function closely with diuresis.  3 history of hyperlipidemia-add Crestor 40 mg daily.  Check lipids and liver in 8 weeks.  4  hypertension-blood pressure is borderline.  Will follow closely and add guideline directed medical therapy as tolerated.  [name]      Cardiology Consultation   Patient ID: Cody [name] MRN: [id number]; DOB: 1951/03/06  Admit date: 02/06/2024 Date of Consult: 02/07/2024  PCP:  Cody [name], No   [facility] Providers Cardiologist:  None        Patient Profile: Cody [name] is a 73 y.o. male with a hx of hypertension, hyperlipidemia, T2DM, neuropathy, esophageal stricture and memory impairment who is being seen 02/07/2024 for the evaluation of new heart failure at the request of hospital medicine.  History of Present Illness: Cody [name] is a 73 year old male with history listed above and no documented past cardiac history. During interview with patient he shares he thinks he was previously diagnosed with heart failure over 6 years ago while he was in [place] but he is unsure. He was previously on atorvastatin , coreg  3.125 mg BID, lisinopril  40 mg, spironolactone  25 mg, and metformin  in 2020. He is unsure what medications he was most recently taking, but approximately two months ago he decided to stop taking these medications due to personal reasons, no side effects or SDOH effecting his ability to take these medications. Based on chart review it appears he has a long history of medication noncompliance.   On 02/06/24 Cody [name] was transferred from [facility] for intermittent shortness of breath. He arrived to the ED and remained hemodynamically stable. Labs were K 4.3  Na 139  Cr 1.34  BNP 1044,  troponin 81->> 95. CTA showed small bilateral pleural effusions with mild  coronary artery calcification, patient was given IV lasix 40 mg.   Patient shares he has been short of breath for over a week, but in the last 5 days he started to become more short of breath with exertion. No shortness of breath with rest. He denies chest pain now or with exertion. No orthopnea or PND.  Denies peripheral edema. Denies cough or recent illness. 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   Past Medical History:  Diagnosis Date   Arthritis    Balance problem    Cataract    Depression    Diabetes mellitus without complication (HCC)    Diabetic neuropathy (HCC) 02/19/2019   Esophageal stricture    Glaucoma    Hiatal hernia    Hyperlipidemia    Hypertension    Numbness     Past Surgical History:  Procedure Laterality Date   COLONOSCOPY     CYST REMOVAL NECK     States he was young   FRACTURE SURGERY     Left foot    GANGLION CYST EXCISION Left    TONSILLECTOMY         Scheduled Meds:  enoxaparin (LOVENOX) injection  40 mg Subcutaneous Q24H   furosemide  40 mg Intravenous Daily   insulin aspart  0-15 Units Subcutaneous TID WC   Continuous Infusions:  PRN Meds: acetaminophen **OR** acetaminophen, ondansetron **OR** ondansetron (ZOFRAN) IV, senna-docusate  Allergies:   No Known Allergies  Social History:   Social History   Socioeconomic History   Marital status: Single    Spouse name: Not on file   Number of children: 0   Years of education: Not on file   Highest education level: 12th grade  Occupational History   Occupation: Retired   Tobacco Use   Smoking status: Never   Smokeless tobacco: Current    Types: Designer, multimedia Use   Vaping status: Never Used  Substance and Sexual Activity   Alcohol use: Not Currently   Drug use: Never   Sexual activity: Not on file  Other Topics Concern   Not on file  Social History Narrative   Right handed    Caffeine 1-2 cups daily    Lives at home with brother Cody [name]    Social Drivers of Corporate investment banker Strain: Not on file  Food Insecurity: Not on file  Transportation Needs: Not on file  Physical Activity: Not on file  Stress: Not on file  Social Connections: Not on file  Intimate Partner Violence: Not on file    Family History:    Family History  Problem Relation Age of Onset    Obesity Mother    Heart failure Father    Diabetes Brother    Hypertension Brother    Colon cancer Neg Hx    Esophageal cancer Neg Hx    Pancreatic cancer Neg Hx    Stomach cancer Neg Hx    Rectal cancer Neg Hx    Colon polyps Neg Hx      ROS:  Please see the history of present illness.   All other ROS reviewed and negative.     Physical Exam/Data: Vitals:   02/07/24 0604 02/07/24 0727 02/07/24 1001 02/07/24 1138  BP:  96/69  103/69  Pulse:  77  82  Resp:  17  17  Temp: 97.7 F (36.5 C)  98.5 F (36.9 C)   TempSrc: Oral  Oral   SpO2:  96%  98%  Weight:  Height:       No intake or output data in the 24 hours ending 02/07/24 1322    02/06/2024   12:38 PM 09/19/2023    4:01 PM 08/22/2023    9:47 AM  Last 3 Weights  Weight (lbs) 142 lb 138 lb 138 lb  Weight (kg) 64.411 kg 62.596 kg 62.596 kg     Body mass index is 22.92 kg/m.  General:  Well nourished, well developed, in no acute distress HEENT: normal Neck: no JVD Vascular: No carotid bruits; Distal pulses 1+ bilaterally Cardiac:  normal S1, S2; RRR; no murmur  Lungs:  clear to auscultation bilaterally, distant rales at bases bilaterally  Abd: soft, nontender   Ext: no edema Musculoskeletal:  No deformities, BUE and BLE strength normal and equal Skin: warm and dry  Neuro:  CNs 2-12 intact, no focal abnormalities noted Psych:  Normal affect   EKG:  The EKG was personally reviewed and demonstrates:  Sinus rhythm with RBBB Telemetry:  Telemetry was personally reviewed and demonstrates:  sinus rhythm RBBB with infrequent PVCs  Relevant CV Studies: No priors  Laboratory Data: High Sensitivity Troponin:   Recent Labs  Lab 02/06/24 1539 02/06/24 1831 02/07/24 0200 02/07/24 0602  TROPONINIHS 81* 95* 110* 103*     Chemistry Recent Labs  Lab 02/06/24 1545 02/07/24 0200  NA 139 140  K 4.3 4.3  CL 107 105  CO2 20* 22  GLUCOSE 144* 124*  BUN 28* 28*  CREATININE 1.34* 1.46*  CALCIUM  9.4 9.7   GFRNONAA 56* 50*  ANIONGAP 12 13    No results for input(s): "PROT", "ALBUMIN", "AST", "ALT", "ALKPHOS", "BILITOT" in the last 168 hours. Lipids  Recent Labs  Lab 02/07/24 0200  CHOL 93  TRIG 175*  HDL 38*  LDLCALC 20  CHOLHDL 2.4    Hematology Recent Labs  Lab 02/06/24 1545 02/07/24 0200  WBC 8.0 9.2  RBC 4.58 4.76  HGB 13.7 14.4  HCT 42.9 42.9  MCV 93.7 90.1  MCH 29.9 30.3  MCHC 31.9 33.6  RDW 13.9 13.8  PLT 280 315   Thyroid No results for input(s): "TSH", "FREET4" in the last 168 hours.  BNP Recent Labs  Lab 02/06/24 1545  BNP 1,044.3*    DDimer No results for input(s): "DDIMER" in the last 168 hours.  Radiology/Studies:  CT Angio Chest PE W and/or Wo Contrast Result Date: 02/06/2024 CLINICAL DATA:  Dyspnea. EXAM: CT ANGIOGRAPHY CHEST WITH CONTRAST TECHNIQUE: Multidetector CT imaging of the chest was performed using the standard protocol during bolus administration of intravenous contrast. Multiplanar CT image reconstructions and MIPs were obtained to evaluate the vascular anatomy. RADIATION DOSE REDUCTION: This exam was performed according to the departmental dose-optimization program which includes automated exposure control, adjustment of the mA and/or kV according to patient size and/or use of iterative reconstruction technique. CONTRAST:  65mL OMNIPAQUE IOHEXOL 350 MG/ML SOLN COMPARISON:  None Available. FINDINGS: Cardiovascular: There is marked severity calcification of the thoracic aorta without evidence of aortic aneurysm. Satisfactory opacification of the pulmonary arteries to the segmental level. No evidence of pulmonary embolism. Normal heart size with mild coronary artery calcification. No pericardial effusion. Mediastinum/Nodes: No enlarged mediastinal, hilar, or axillary lymph nodes. The thyroid gland and trachea demonstrate no significant findings. Mild diffuse esophageal wall thickening is seen. Lungs/Pleura: Mild posterior right apical and anteromedial  right upper lobe linear scarring and/or atelectasis is seen. Small bilateral pleural effusions are noted, right slightly greater than left. No pneumothorax is identified. Upper  Abdomen: There is a small to moderate sized hiatal hernia. Musculoskeletal: Multilevel degenerative changes seen throughout the thoracic spine. Review of the MIP images confirms the above findings. IMPRESSION: 1. No evidence of pulmonary embolism. 2. Small bilateral pleural effusions, right slightly greater than left. 3. Mild posterior right apical and anteromedial right upper lobe linear scarring and/or atelectasis. 4. Small to moderate sized hiatal hernia. 5. Mild diffuse esophageal wall thickening which may represent esophagitis. 6. Aortic atherosclerosis. Electronically Signed   By: Virgle Grime M.D.   On: 02/06/2024 21:14   DG Chest 2 View Result Date: 02/06/2024 CLINICAL DATA:  Shortness of breath EXAM: CHEST - 2 VIEW COMPARISON:  None Available. FINDINGS: The heart size and mediastinal contours are within normal limits. Both lungs are clear. The visualized skeletal structures notable for degenerative changes. Minimal blunting of the costophrenic angles which may represent chronic scarring. IMPRESSION: 1. No definite plain film evidence of acute process. Electronically Signed   By: Reagan Camera M.D.   On: 02/06/2024 14:46     Assessment and Plan: New onset heart failure reduced ejection fraction Patient presenting with progressive shortness of breath with exertion over the past 5 days. He denies other symptoms. Possibility of remote history of heart failure, but nothing documented in EHR. BNP 1044. CT showed bilateral small pleural effusions (R>L). Echo results still pending, preliminary results shows severe LV dysfunction with inferolateral akinesis and moderate to severe mitral regurgitation. Most likely etiology is ischemic cardiomyopathy, plan is to take patient to [facility] early next week. He is agreeable to this plan. He  is euvolemic on exam today. Patient has a history of medication non-compliance, patient verbalized he stopped taking his medications about 2 months ago.Discussed with patient about medication compliance, and he is agreeable to continue medication therapy started in the hospital after discharge.  -echo pending -IV lasix 40 mg daily -start spironolactone  12.5 mg daily and Jardiance 10 mg daily.  -will plan to start losartan 12.5 mg daily if blood pressure allows with plans to transition to entresto  -will hold-off on beta blocker for due to current exacerbation, will initiate once he is compensated    Elevated troponin (81->95->110->103) Patient is without chest pain. Most likely demand related given current heart failure onset.   Hypertension Patient's blood pressure has been borderline/low during admission 96/69, will continue to monitor for now and will add hypertensive medications as tolerated   HLD 02/07/24: LDL 20, HDL 38. With patient's presentation and most likely ischemic cardiomyopathy would like high intensity statin.  -start crestor 40mg   -will obtain follow-up lipid panel and LFTs in 8 weeks  5. AKI vs CKD On admission Cr 1.34 with increase to 1.46.  -will continue to monitor   Per primary 6.T2DM 7. Memory impairment    Risk Assessment/Risk Scores:       [place]  Heart Association (NYHA) Functional Class NYHA Class III       For questions or updates, please contact [facility] Please consult [email or web address] for contact info under    Signed, Mabel Savage, PA-C  02/07/2024 1:22 PM

## 2024-02-07 NOTE — Progress Notes (Signed)
  Echocardiogram 2D Echocardiogram has been performed.  Jeannia Tatro L Estelene Carmack RDCS 02/07/2024, 8:26 AM

## 2024-02-07 NOTE — Progress Notes (Signed)
 PROGRESS NOTE    Cody [name]  WGN:[id number] DOB: 05/22/1951 DOA: 02/06/2024 PCP: Pcp, No   Brief Narrative:   73 y.o. male with medical history significant for HTN, HLD, T2DM, diabetic neuropathy, depression and memory impairment presented with worsening shortness of breath.  On presentation, he was on room air, creatinine of 1.34, BNP of 1044, high sensitive troponins of 81 and 95.  EKG was nonischemic.  Chest x-ray showed no acute cardiopulmonary disease.  CTA chest was negative for PE but showed bilateral small pleural effusions.  He was started on IV Lasix.  Assessment & Plan:   Possible acute unspecified heart failure Elevated troponin: Possibly from demand ischemia -Presented with worsening shortness of breath with elevated BNP and bilateral pleural effusions on imaging. - Continue IV Lasix.  Patient currently does not have IV access and does not want new IV access to be put or any further labs.  He does not want to stay in the hospital as well: Explained to him that 2D echo and cardiology evaluation is pending.  If he does not want to stay, he will have to leave AGAINST MEDICAL ADVICE.  Currently denies any chest pain. - Strict input and output.  Daily weights.  Fluid restriction.  Repeat a.m. labs  Unclear if this is AKI versus CKD -Creatinine 1.34 on presentation and 1.46 today.  No basic recent labs available.  Unclear if this is AKI versus CKD  Diabetes mellitus type 2 - Continue CBGs with SSI.  Carb modified diet.  Follow A1c  Hypertension Hyperlipidemia Noncompliance to medications --blood pressure on the lower side.  Continue Lasix - Patient is noncompliant to medications at home including statin.  Outpatient follow-up with PCP  Memory impairment - Currently alert awake oriented x 3.  Has history of mild cognitive impairment.  Outpatient follow-up with PCP and/or neurology  DVT prophylaxis: Lovenox Code Status: DNR Family Communication: None at bedside Disposition  Plan: Status is: Inpatient Remains inpatient appropriate because: Of severity of illness    Consultants: Cardiology  Procedures: None  Antimicrobials: None   Subjective: Patient seen and examined at bedside.  Feels slightly better but still short of breath with exertion.  Does not want any more blood draw or IV line.  No seizures, vomiting, agitation reported by nursing staff.  Objective: Vitals:   02/07/24 0400 02/07/24 0604 02/07/24 0727 02/07/24 1001  BP: 117/80  96/69   Pulse: 73  77   Resp: 17  17   Temp:  97.7 F (36.5 C)  98.5 F (36.9 C)  TempSrc:  Oral  Oral  SpO2: 98%  96%   Weight:      Height:       No intake or output data in the 24 hours ending 02/07/24 1048 Filed Weights   02/06/24 1238  Weight: 64.4 kg    Examination:  General exam: Appears calm and comfortable.  Looks chronically ill and deconditioned.  On room air. Respiratory system: Bilateral decreased breath sounds at bases with basilar crackles Cardiovascular system: S1 & S2 heard, Rate controlled Gastrointestinal system: Abdomen is nondistended, soft and nontender. Normal bowel sounds heard. Extremities: No cyanosis, clubbing; trace lower extremity edema Central nervous system: Alert and oriented. No focal neurological deficits. Moving extremities Skin: No rashes, lesions or ulcers Psychiatry: Flat affect.  Not agitated.     Data Reviewed: I have personally reviewed following labs and imaging studies  CBC: Recent Labs  Lab 02/06/24 1545 02/07/24 0200  WBC 8.0 9.2  HGB 13.7  14.4  HCT 42.9 42.9  MCV 93.7 90.1  PLT 280 315   Basic Metabolic Panel: Recent Labs  Lab 02/06/24 1545 02/07/24 0200  NA 139 140  K 4.3 4.3  CL 107 105  CO2 20* 22  GLUCOSE 144* 124*  BUN 28* 28*  CREATININE 1.34* 1.46*  CALCIUM  9.4 9.7   GFR: Estimated Creatinine Clearance: 40.7 mL/min (A) (by C-G formula based on SCr of 1.46 mg/dL (H)). Liver Function Tests: No results for input(s): "AST",  "ALT", "ALKPHOS", "BILITOT", "PROT", "ALBUMIN" in the last 168 hours. No results for input(s): "LIPASE", "AMYLASE" in the last 168 hours. No results for input(s): "AMMONIA" in the last 168 hours. Coagulation Profile: No results for input(s): "INR", "PROTIME" in the last 168 hours. Cardiac Enzymes: No results for input(s): "CKTOTAL", "CKMB", "CKMBINDEX", "TROPONINI" in the last 168 hours. BNP (last 3 results) No results for input(s): "PROBNP" in the last 8760 hours. HbA1C: No results for input(s): "HGBA1C" in the last 72 hours. CBG: Recent Labs  Lab 02/07/24 0825  GLUCAP 212*   Lipid Profile: Recent Labs    02/07/24 0200  CHOL 93  HDL 38*  LDLCALC 20  TRIG 956*  CHOLHDL 2.4   Thyroid Function Tests: No results for input(s): "TSH", "T4TOTAL", "FREET4", "T3FREE", "THYROIDAB" in the last 72 hours. Anemia Panel: No results for input(s): "VITAMINB12", "FOLATE", "FERRITIN", "TIBC", "IRON", "RETICCTPCT" in the last 72 hours. Sepsis Labs: No results for input(s): "PROCALCITON", "LATICACIDVEN" in the last 168 hours.  Recent Results (from the past 240 hours)  SARS Coronavirus 2 by RT PCR (hospital order, performed in [facility] hospital lab) *cepheid single result test* Anterior Nasal Swab     Status: None   Collection Time: 02/06/24  3:56 PM   Specimen: Anterior Nasal Swab  Result Value Ref Range Status   SARS Coronavirus 2 by RT PCR NEGATIVE NEGATIVE Final    Comment: Performed at [facility], [address], [place], [place] [address]         Radiology Studies: CT Angio Chest PE W and/or Wo Contrast Result Date: 02/06/2024 CLINICAL DATA:  Dyspnea. EXAM: CT ANGIOGRAPHY CHEST WITH CONTRAST TECHNIQUE: Multidetector CT imaging of the chest was performed using the standard protocol during bolus administration of intravenous contrast. Multiplanar CT image reconstructions and MIPs were obtained to evaluate the vascular anatomy. RADIATION DOSE REDUCTION: This exam was  performed according to the departmental dose-optimization program which includes automated exposure control, adjustment of the mA and/or kV according to patient size and/or use of iterative reconstruction technique. CONTRAST:  65mL OMNIPAQUE IOHEXOL 350 MG/ML SOLN COMPARISON:  None Available. FINDINGS: Cardiovascular: There is marked severity calcification of the thoracic aorta without evidence of aortic aneurysm. Satisfactory opacification of the pulmonary arteries to the segmental level. No evidence of pulmonary embolism. Normal heart size with mild coronary artery calcification. No pericardial effusion. Mediastinum/Nodes: No enlarged mediastinal, hilar, or axillary lymph nodes. The thyroid gland and trachea demonstrate no significant findings. Mild diffuse esophageal wall thickening is seen. Lungs/Pleura: Mild posterior right apical and anteromedial right upper lobe linear scarring and/or atelectasis is seen. Small bilateral pleural effusions are noted, right slightly greater than left. No pneumothorax is identified. Upper Abdomen: There is a small to moderate sized hiatal hernia. Musculoskeletal: Multilevel degenerative changes seen throughout the thoracic spine. Review of the MIP images confirms the above findings. IMPRESSION: 1. No evidence of pulmonary embolism. 2. Small bilateral pleural effusions, right slightly greater than left. 3. Mild posterior right apical and anteromedial  right upper lobe linear scarring and/or atelectasis. 4. Small to moderate sized hiatal hernia. 5. Mild diffuse esophageal wall thickening which may represent esophagitis. 6. Aortic atherosclerosis. Electronically Signed   By: Virgle Grime M.D.   On: 02/06/2024 21:14   DG Chest 2 View Result Date: 02/06/2024 CLINICAL DATA:  Shortness of breath EXAM: CHEST - 2 VIEW COMPARISON:  None Available. FINDINGS: The heart size and mediastinal contours are within normal limits. Both lungs are clear. The visualized skeletal structures  notable for degenerative changes. Minimal blunting of the costophrenic angles which may represent chronic scarring. IMPRESSION: 1. No definite plain film evidence of acute process. Electronically Signed   By: Reagan Camera M.D.   On: 02/06/2024 14:46        Scheduled Meds:  enoxaparin (LOVENOX) injection  40 mg Subcutaneous Q24H   furosemide  40 mg Intravenous Daily   insulin aspart  0-15 Units Subcutaneous TID WC   Continuous Infusions:        Audria Leather, MD [facility] 02/07/2024, 10:48 AM

## 2024-02-07 NOTE — Evaluation (Signed)
 Physical Therapy Evaluation Patient Details Name: Cody [name] MRN: [id number] DOB: November 30, 1950 Today's Date: 02/07/2024  History of Present Illness  73 y.o. male presented 5/29 with worsening shortness of breath. W/u for CHF. Also with AKI.  PMH HTN, HLD, T2DM, diabetic neuropathy, depression and memory impairment  Clinical Impression  Pt admitted with above diagnosis. Pt was able to ambulate with RW with overall good safety awareness.  Pt appears to be close to baseline.  BP was low with transition to sitting but recovered with standing.  Supine 98% RA, 96/76, 91 bpm; sitting 78/68, 98 bpm; standing 95/70, 100 bpm. Will follow acutely and progress pt as able.  Pt currently with functional limitations due to the deficits listed below (see PT Problem List). Pt will benefit from acute skilled PT to increase their independence and safety with mobility to allow discharge.           If plan is discharge home, recommend the following: Assist for transportation   Can travel by private vehicle        Equipment Recommendations None recommended by PT  Recommendations for Other Services       Functional Status Assessment Patient has had a recent decline in their functional status and demonstrates the ability to make significant improvements in function in a reasonable and predictable amount of time.     Precautions / Restrictions Precautions Precautions: Fall Restrictions Weight Bearing Restrictions Per Provider Order: No      Mobility  Bed Mobility Overal bed mobility: Independent                  Transfers Overall transfer level: Independent                      Ambulation/Gait Ambulation/Gait assistance: Supervision Gait Distance (Feet): 180 Feet Assistive device: Rolling walker (2 wheels) Gait Pattern/deviations: Step-through pattern, Decreased stride length   Gait velocity interpretation: <1.31 ft/sec, indicative of household ambulator   General Gait  Details: Pt able to ambulate with RW with supervision.  Very safe with rW and pt states he feels he is at baseline.  Stairs            Wheelchair Mobility     Tilt Bed    Modified Rankin (Stroke Patients Only)       Balance Overall balance assessment: Needs assistance Sitting-balance support: No upper extremity supported, Feet supported Sitting balance-Leahy Scale: Good     Standing balance support: Bilateral upper extremity supported, No upper extremity supported, During functional activity Standing balance-Leahy Scale: Fair Standing balance comment: relies on RW for dynamic activity                             Pertinent Vitals/Pain Pain Assessment Pain Assessment: No/denies pain    Home Living Family/patient expects to be discharged to:: Private residence Living Arrangements: Alone   Type of Home: Apartment Home Access: Level entry       Home Layout: One level Home Equipment: Grab bars - tub/shower;Shower seat;Grab bars - toilet;Rolling Walker (2 wheels);Rollator (4 wheels) Additional Comments: [facility] - Mon and Thurs - 6 hours and they pick pt up.  Pt paints and draws other days. Brother gets pts groceries    Prior Function Prior Level of Function : Independent/Modified Independent;History of Falls (last six months) (5 falls in last 6 months)             Mobility  Comments: used RW or rollator at all times ADLs Comments: I with B/D     Extremity/Trunk Assessment   Upper Extremity Assessment Upper Extremity Assessment: Defer to OT evaluation    Lower Extremity Assessment Lower Extremity Assessment: Generalized weakness    Cervical / Trunk Assessment Cervical / Trunk Assessment: Normal  Communication   Communication Communication: No apparent difficulties    Cognition Arousal: Alert Behavior During Therapy: WFL for tasks assessed/performed   PT - Cognitive impairments: Memory                          Following commands: Intact       Cueing       General Comments General comments (skin integrity, edema, etc.): 98% RA, 96/76 91 bpm supine; sitting 78/68, 98 bpm; standing 95/70, 100 bpm    Exercises     Assessment/Plan    PT Assessment Patient needs continued PT services  PT Problem List Decreased activity tolerance;Decreased balance;Decreased mobility;Decreased knowledge of use of DME;Decreased safety awareness       PT Treatment Interventions DME instruction;Gait training;Functional mobility training;Therapeutic activities;Therapeutic exercise;Balance training;Patient/family education    PT Goals (Current goals can be found in the Care Plan section)  Acute Rehab PT Goals Patient Stated Goal: to go home PT Goal Formulation: With patient Time For Goal Achievement: 02/21/24 Potential to Achieve Goals: Good    Frequency Min 2X/week     Co-evaluation               AM-PAC PT "6 Clicks" Mobility  Outcome Measure Help needed turning from your back to your side while in a flat bed without using bedrails?: None Help needed moving from lying on your back to sitting on the side of a flat bed without using bedrails?: None Help needed moving to and from a bed to a chair (including a wheelchair)?: None Help needed standing up from a chair using your arms (e.g., wheelchair or bedside chair)?: None Help needed to walk in hospital room?: A Little Help needed climbing 3-5 steps with a railing? : A Little 6 Click Score: 22    End of Session Equipment Utilized During Treatment: Gait belt Activity Tolerance: Patient limited by fatigue Patient left: with call bell/phone within reach (stretcher) Nurse Communication: Mobility status PT Visit Diagnosis: Muscle weakness (generalized) (M62.81)    Time: 6578-4696 PT Time Calculation (min) (ACUTE ONLY): 31 min   Charges:   PT Evaluation $PT Eval Moderate Complexity: 1 Mod PT Treatments $Gait Training: 8-22 mins PT General  Charges $$ ACUTE PT VISIT: 1 Visit         Maripaz Mullan M,PT Acute Rehab Services [phone]   Florencia Hunter 02/07/2024, 2:01 PM

## 2024-02-07 NOTE — Progress Notes (Signed)
 CSW received call Shelvy Dickens at [facility] to discuss patient. CSW informed Shelvy Dickens of planned admission and provided unit CSW contact information.  Shepard Dicker, MSW, LCSW Transitions of Care  Clinical Social Worker II [phone]

## 2024-02-08 DIAGNOSIS — I429 Cardiomyopathy, unspecified: Secondary | ICD-10-CM | POA: Diagnosis not present

## 2024-02-08 DIAGNOSIS — R0600 Dyspnea, unspecified: Secondary | ICD-10-CM | POA: Diagnosis not present

## 2024-02-08 DIAGNOSIS — I509 Heart failure, unspecified: Secondary | ICD-10-CM | POA: Diagnosis not present

## 2024-02-08 DIAGNOSIS — I10 Essential (primary) hypertension: Secondary | ICD-10-CM

## 2024-02-08 DIAGNOSIS — I5021 Acute systolic (congestive) heart failure: Secondary | ICD-10-CM | POA: Diagnosis not present

## 2024-02-08 DIAGNOSIS — I340 Nonrheumatic mitral (valve) insufficiency: Secondary | ICD-10-CM

## 2024-02-08 DIAGNOSIS — N179 Acute kidney failure, unspecified: Secondary | ICD-10-CM | POA: Diagnosis not present

## 2024-02-08 DIAGNOSIS — E1165 Type 2 diabetes mellitus with hyperglycemia: Secondary | ICD-10-CM

## 2024-02-08 LAB — BASIC METABOLIC PANEL WITH GFR
Anion gap: 9 (ref 5–15)
BUN: 36 mg/dL — ABNORMAL HIGH (ref 8–23)
CO2: 24 mmol/L (ref 22–32)
Calcium: 9.1 mg/dL (ref 8.9–10.3)
Chloride: 106 mmol/L (ref 98–111)
Creatinine, Ser: 1.73 mg/dL — ABNORMAL HIGH (ref 0.61–1.24)
GFR, Estimated: 41 mL/min — ABNORMAL LOW (ref >60)
Glucose, Bld: 109 mg/dL — ABNORMAL HIGH (ref 70–99)
Potassium: 3.9 mmol/L (ref 3.5–5.1)
Sodium: 139 mmol/L (ref 135–145)

## 2024-02-08 LAB — CBC WITH DIFFERENTIAL/PLATELET
Abs Immature Granulocytes: 0.01 10*3/uL (ref 0.00–0.07)
Basophils Absolute: 0 10*3/uL (ref 0.0–0.1)
Basophils Relative: 1 %
Eosinophils Absolute: 0.2 10*3/uL (ref 0.0–0.5)
Eosinophils Relative: 2 %
HCT: 38.5 % — ABNORMAL LOW (ref 39.0–52.0)
Hemoglobin: 13.1 g/dL (ref 13.0–17.0)
Immature Granulocytes: 0 %
Lymphocytes Relative: 26 %
Lymphs Abs: 2.1 10*3/uL (ref 0.7–4.0)
MCH: 30.2 pg (ref 26.0–34.0)
MCHC: 34 g/dL (ref 30.0–36.0)
MCV: 88.7 fL (ref 80.0–100.0)
Monocytes Absolute: 0.7 10*3/uL (ref 0.1–1.0)
Monocytes Relative: 9 %
Neutro Abs: 4.8 10*3/uL (ref 1.7–7.7)
Neutrophils Relative %: 62 %
Platelets: 265 10*3/uL (ref 150–400)
RBC: 4.34 MIL/uL (ref 4.22–5.81)
RDW: 13.7 % (ref 11.5–15.5)
WBC: 7.8 10*3/uL (ref 4.0–10.5)
nRBC: 0 % (ref 0.0–0.2)

## 2024-02-08 LAB — GLUCOSE, CAPILLARY
Glucose-Capillary: 115 mg/dL — ABNORMAL HIGH (ref 70–99)
Glucose-Capillary: 123 mg/dL — ABNORMAL HIGH (ref 70–99)
Glucose-Capillary: 133 mg/dL — ABNORMAL HIGH (ref 70–99)
Glucose-Capillary: 171 mg/dL — ABNORMAL HIGH (ref 70–99)

## 2024-02-08 LAB — TSH: TSH: 5.591 u[IU]/mL — ABNORMAL HIGH (ref 0.350–4.500)

## 2024-02-08 LAB — AMMONIA: Ammonia: 27 umol/L (ref 9–35)

## 2024-02-08 LAB — VITAMIN B12: Vitamin B-12: 416 pg/mL (ref 180–914)

## 2024-02-08 LAB — HEMOGLOBIN A1C
Hgb A1c MFr Bld: 6.7 % — ABNORMAL HIGH (ref 4.8–5.6)
Mean Plasma Glucose: 145.59 mg/dL

## 2024-02-08 LAB — MAGNESIUM: Magnesium: 1.8 mg/dL (ref 1.7–2.4)

## 2024-02-08 LAB — D-DIMER, QUANTITATIVE: D-Dimer, Quant: 0.5 ug{FEU}/mL (ref 0.00–0.50)

## 2024-02-08 MED ORDER — FUROSEMIDE 10 MG/ML IJ SOLN
20.0000 mg | Freq: Every day | INTRAMUSCULAR | Status: DC
  Administered 2024-02-09: 20 mg via INTRAVENOUS
  Filled 2024-02-08: qty 2

## 2024-02-08 NOTE — Progress Notes (Signed)
  Progress Note  Patient Name: Cody [name] Date of Encounter: 02/08/2024 [facility] Cardiologist: None   RE: CHF evaluation   Interval Summary   Denies anginal chest pain. Resting in bed comfortably.  Vital Signs Vitals:   02/08/24 0450 02/08/24 0745 02/08/24 0849 02/08/24 1100  BP: 91/67 (!) 86/52 113/68 96/69  Pulse: 80 80 97 82  Resp:  16 16 18   Temp: 98.3 F (36.8 C) 98.4 F (36.9 C) 98.4 F (36.9 C) 98 F (36.7 C)  TempSrc: Oral Oral Oral Oral  SpO2: 98% 96% 96% 98%  Weight: 59.4 kg     Height:        Intake/Output Summary (Last 24 hours) at 02/08/2024 1338 Last data filed at 02/08/2024 1300 Gross per 24 hour  Intake 480 ml  Output 720 ml  Net -240 ml   Net IO Since Admission: -240 mL [02/08/24 1338]      02/08/2024    4:50 AM 02/06/2024   12:38 PM 09/19/2023    4:01 PM  Last 3 Weights  Weight (lbs) 130 lb 15.3 oz 142 lb 138 lb  Weight (kg) 59.4 kg 64.411 kg 62.596 kg      Telemetry/ECG  Sinus rhythm without ectopy- Personally Reviewed  Physical Exam  GEN: No acute distress.   Neck: No JVD Cardiac: RRR, no murmurs, rubs, or gallops.  Respiratory: Clear to auscultation bilaterally. GI: Soft, nontender, non-distended  MS: No edema  Assessment & Plan  73 y.o. male with a hx of hypertension, hyperlipidemia, T2DM, neuropathy, esophageal stricture and memory impairment who is being seen  for the evaluation of new heart failure at the request of hospital medicine.   Shortness of breath: Likely precipitated by stopping his medications for at least 2 months. Clinical findings suggestive of congestive heart failure, echocardiogram illustrates reduced LVEF. Given the hypotension, shortness of breath, poor historian, echocardiogram noting right ventricular pressure overload will check D-dimer.  If D-dimers warrants additional workup will need to discuss given VQ scan versus CT PE protocol based on renal function. According to the patient no prior DVT  or PE.  Acute heart failure with reduced ejection fraction Cardiomyopathy LVEF 30-35%. Continue Jardiance  10 mg p.o. daily. Reduce IV Lasix  to 40 mg daily to 20 mg daily. Will hold off on up titration of GDMT given the acute kidney injury clinic Clinically patient appears euvolemic. Given the new onset of cardiomyopathy, regional wall motion abnormalities, prior cardiology rounder recommended left and right heart catheterization.  Likely will schedule for Monday, February 10, 2024 based on patient's and family wishes and renal function.  Moderate to severe mitral regurgitation: Likely functional.  Focus on diuresis and up titration of GDMT.  Once optimized we will need to wean echocardiogram to follow disease progression.  DM: Reemphasized importance of glycemic control, management per primary team  HTN: Currently hypotensive.  Reduction in medical therapy as discussed above. Monitor for now  HLD: Added Crestor  40mg  po qday this hospitalization.   Esophageal stricture: Management per primary team  Memory impairment: Management per primary team  For questions or updates, please contact [facility] Please consult [email or web address] for contact info under       Signed, Olinda Bertrand, DO, [facility]  A Division of  [facility] [address] [place], [place] [address]  [place], [place] [address] 1:38 PM

## 2024-02-08 NOTE — Plan of Care (Signed)
  Problem: Health Behavior/Discharge Planning: Goal: Ability to manage health-related needs will improve Outcome: Progressing   Problem: Health Behavior/Discharge Planning: Goal: Ability to identify and utilize available resources and services will improve Outcome: Progressing   Problem: Clinical Measurements: Goal: Respiratory complications will improve Outcome: Progressing   Problem: Clinical Measurements: Goal: Cardiovascular complication will be avoided Outcome: Progressing

## 2024-02-08 NOTE — Progress Notes (Signed)
 Mobility Specialist Progress Note:    02/08/24 0945  Mobility  Activity Ambulated with assistance in hallway  Level of Assistance Contact guard assist, steadying assist  Assistive Device Front wheel walker  Distance Ambulated (ft) 150 ft  Activity Response Tolerated well  Mobility Referral Yes  Mobility visit 1 Mobility  Mobility Specialist Start Time (ACUTE ONLY) 0930  Mobility Specialist Stop Time (ACUTE ONLY) 0945  Mobility Specialist Time Calculation (min) (ACUTE ONLY) 15 min   Received pt in bed having no complaints and agreeable to mobility. Pt was asymptomatic throughout ambulation and returned to room w/o fault. Left in bed w/ call bell in reach and all needs met.   Pre mobility BP 113/68 Post mobility BP 104/93  Inetta Manes Mobility Specialist  Please contact vis Secure Chat or  Rehab Office [phone]

## 2024-02-08 NOTE — Progress Notes (Signed)
 PROGRESS NOTE    Cody [name]  ZOX:[id number] DOB: 12-22-1950 DOA: 02/06/2024 PCP: Pcp, No   Brief Narrative:   73 y.o. male with medical history significant for HTN, HLD, T2DM, diabetic neuropathy, depression and memory impairment presented with worsening shortness of breath.  On presentation, he was on room air, creatinine of 1.34, BNP of 1044, high sensitive troponins of 81 and 95.  EKG was nonischemic.  Chest x-ray showed no acute cardiopulmonary disease.  CTA chest was negative for PE but showed bilateral small pleural effusions.  He was started on IV Lasix .  Cardiology was consulted.  Assessment & Plan:   Possible acute systolic heart failure Severe mitral regurgitation Elevated troponin: Possibly from demand ischemia -Presented with worsening shortness of breath with elevated BNP and bilateral pleural effusions on imaging. -Patient refused new IV line placement on 02/07/2024 and did not receive any IV Lasix  due to that - Strict input and output.  Daily weights.  Fluid restriction.   - 2D echo showed EF of 30 to 35% and severe mitral regurgitation.  Cardiology following.  Cardiology started the patient on Jardiance  and spironolactone .  Continue IV Lasix .  Cardiology is planning for cardiac catheterization early next week.  AKI on possible CKD stage IIIa -Creatinine 1.34 on presentation and 1.73 today.  No basic recent labs available.  Patient possibly has CKD -Monitor with diuresis.   Diabetes mellitus type 2 - Continue CBGs with SSI.  Carb modified diet.  Follow A1c  Hypertension Hyperlipidemia Noncompliance to medications --blood pressure on the lower side.   - Patient is noncompliant to medications at home including statin.  Outpatient follow-up with PCP - Cardiology has started the patient on Crestor .  Memory impairment - Currently alert awake oriented x 3.  Has history of mild cognitive impairment.  Outpatient follow-up with PCP and/or neurology  DVT prophylaxis:  Lovenox  Code Status: DNR Family Communication: None at bedside Disposition Plan: Status is: Inpatient Remains inpatient appropriate because: Of severity of illness    Consultants: Cardiology  Procedures: None  Antimicrobials: None   Subjective: Patient seen and examined at bedside.  Poor historian.  Denies any chest pain, worsening shortness breath.  No vomiting, fever reported.    Objective: Vitals:   02/07/24 2123 02/08/24 0040 02/08/24 0450 02/08/24 0745  BP: 107/81 96/61 91/67  (!) 86/52  Pulse: 97 85 80 80  Resp: 18   16  Temp: (!) 97.4 F (36.3 C) 97.9 F (36.6 C) 98.3 F (36.8 C) 98.4 F (36.9 C)  TempSrc: Oral Oral Oral Oral  SpO2: 97% 99% 98% 96%  Weight:   59.4 kg   Height:        Intake/Output Summary (Last 24 hours) at 02/08/2024 0749 Last data filed at 02/07/2024 2200 Gross per 24 hour  Intake 240 ml  Output --  Net 240 ml   Filed Weights   02/06/24 1238 02/08/24 0450  Weight: 64.4 kg 59.4 kg    Examination:  General: Currently on room air.  No distress ENT/neck: No thyromegaly.  JVD is not elevated  respiratory: Decreased breath sounds at bases bilaterally with some crackles; no wheezing  CVS: S1-S2 heard, rate controlled currently Abdominal: Soft, nontender, slightly distended; no organomegaly, normal bowel sounds are heard Extremities: Trace lower extremity edema; no cyanosis  CNS: Awake and alert.  Slow to respond.  Poor historian.  No focal neurologic deficit.  Moves extremities Lymph: No obvious lymphadenopathy Skin: No obvious ecchymosis/lesions  psych: Currently not agitated.  Flat affect mostly.  Musculoskeletal: No obvious joint swelling/deformity     Data Reviewed: I have personally reviewed following labs and imaging studies  CBC: Recent Labs  Lab 02/06/24 1545 02/07/24 0200 02/08/24 0313  WBC 8.0 9.2 7.8  NEUTROABS  --   --  4.8  HGB 13.7 14.4 13.1  HCT 42.9 42.9 38.5*  MCV 93.7 90.1 88.7  PLT 280 315 265   Basic  Metabolic Panel: Recent Labs  Lab 02/06/24 1545 02/07/24 0200 02/08/24 0313  NA 139 140 139  K 4.3 4.3 3.9  CL 107 105 106  CO2 20* 22 24  GLUCOSE 144* 124* 109*  BUN 28* 28* 36*  CREATININE 1.34* 1.46* 1.73*  CALCIUM  9.4 9.7 9.1  MG  --   --  1.8   GFR: Estimated Creatinine Clearance: 32 mL/min (A) (by C-G formula based on SCr of 1.73 mg/dL (H)). Liver Function Tests: No results for input(s): "AST", "ALT", "ALKPHOS", "BILITOT", "PROT", "ALBUMIN" in the last 168 hours. No results for input(s): "LIPASE", "AMYLASE" in the last 168 hours. Recent Labs  Lab 02/08/24 0313  AMMONIA 27   Coagulation Profile: No results for input(s): "INR", "PROTIME" in the last 168 hours. Cardiac Enzymes: No results for input(s): "CKTOTAL", "CKMB", "CKMBINDEX", "TROPONINI" in the last 168 hours. BNP (last 3 results) No results for input(s): "PROBNP" in the last 8760 hours. HbA1C: Recent Labs    02/08/24 0313  HGBA1C 6.7*   CBG: Recent Labs  Lab 02/07/24 0825 02/07/24 1134 02/07/24 1605 02/07/24 2124 02/08/24 0625  GLUCAP 212* 112* 129* 110* 115*   Lipid Profile: Recent Labs    02/07/24 0200  CHOL 93  HDL 38*  LDLCALC 20  TRIG 161*  CHOLHDL 2.4   Thyroid Function Tests: Recent Labs    02/08/24 0313  TSH 5.591*   Anemia Panel: Recent Labs    02/08/24 0313  VITAMINB12 416   Sepsis Labs: No results for input(s): "PROCALCITON", "LATICACIDVEN" in the last 168 hours.  Recent Results (from the past 240 hours)  SARS Coronavirus 2 by RT PCR (hospital order, performed in [facility] hospital lab) *cepheid single result test* Anterior Nasal Swab     Status: None   Collection Time: 02/06/24  3:56 PM   Specimen: Anterior Nasal Swab  Result Value Ref Range Status   SARS Coronavirus 2 by RT PCR NEGATIVE NEGATIVE Final    Comment: Performed at [facility], [address], [place], [place] [address]         Radiology Studies: ECHOCARDIOGRAM COMPLETE Result Date:  02/07/2024    ECHOCARDIOGRAM REPORT   Patient Name:   KALIB BHAGAT Date of Exam: 02/07/2024 Medical Rec #:  [id number]   Height:       66.0 in Accession #:    [id number]  Weight:       142.0 lb Date of Birth:  Sep 18, 1950   BSA:          1.729 m Patient Age:    72 years    BP:           140/111 mmHg Patient Gender: M           HR:           99 bpm. Exam Location:  Inpatient Procedure: 2D Echo, 3D Echo, Cardiac Doppler, Color Doppler and Strain Analysis            (Both Spectral and Color Flow Doppler were utilized during  procedure). Indications:     Dyspnea  History:         Patient has no prior history of Echocardiogram examinations.                  Risk Factors:Hypertension and Diabetes.  Sonographer:     Aldon Ambrosia Referring Phys:  [id number] PROSPER M AMPONSAH Diagnosing Phys: Jules Oar MD IMPRESSIONS  1. Left ventricular ejection fraction, by estimation, is 30 to 35%. The left ventricle has moderately decreased function. The left ventricle demonstrates global hypokinesis with inferior and inferolateral akinesis. Left ventricular diastolic parameters are indeterminate. There is the interventricular septum is flattened in systole, consistent with right ventricular pressure overload.  2. Right ventricular systolic function is moderately reduced. The right ventricular size is normal. Tricuspid regurgitation signal is inadequate for assessing PA pressure.  3. Left atrial size was moderately dilated.  4. The mitral valve is normal in structure. Severe mitral valve regurgitation. No evidence of mitral stenosis.  5. The aortic valve is tricuspid. There is mild calcification of the aortic valve. Aortic valve regurgitation is not visualized. Aortic valve sclerosis/calcification is present, without any evidence of aortic stenosis.  6. The inferior vena cava is normal in size with greater than 50% respiratory variability, suggesting right atrial pressure of 3 mmHg. FINDINGS  Left Ventricle: Left ventricular  ejection fraction, by estimation, is 30 to 35%. The left ventricle has moderately decreased function. The left ventricle demonstrates global hypokinesis. The left ventricular internal cavity size was normal in size. There is no left ventricular hypertrophy. The interventricular septum is flattened in systole, consistent with right ventricular pressure overload. Left ventricular diastolic parameters are indeterminate.  LV Wall Scoring: The inferior wall and posterior wall are akinetic. Right Ventricle: The right ventricular size is normal. No increase in right ventricular wall thickness. Right ventricular systolic function is moderately reduced. Tricuspid regurgitation signal is inadequate for assessing PA pressure. Left Atrium: Left atrial size was moderately dilated. Right Atrium: Right atrial size was normal in size. Pericardium: There is no evidence of pericardial effusion. Mitral Valve: The mitral valve is normal in structure. Severe mitral valve regurgitation, with centrally-directed jet. No evidence of mitral valve stenosis. MV peak gradient, 4.0 mmHg. The mean mitral valve gradient is 1.0 mmHg. Tricuspid Valve: The tricuspid valve is normal in structure. Tricuspid valve regurgitation is mild . No evidence of tricuspid stenosis. Aortic Valve: The aortic valve is tricuspid. There is mild calcification of the aortic valve. Aortic valve regurgitation is not visualized. Aortic valve sclerosis/calcification is present, without any evidence of aortic stenosis. Aortic valve mean gradient measures 2.0 mmHg. Aortic valve peak gradient measures 3.2 mmHg. Aortic valve area, by VTI measures 1.66 cm. Pulmonic Valve: The pulmonic valve was normal in structure. Pulmonic valve regurgitation is trivial. No evidence of pulmonic stenosis. Aorta: The aortic root is normal in size and structure. Venous: The inferior vena cava is normal in size with greater than 50% respiratory variability, suggesting right atrial pressure of 3  mmHg. IAS/Shunts: No atrial level shunt detected by color flow Doppler.  LEFT VENTRICLE PLAX 2D LVIDd:         5.40 cm      Diastology LVIDs:         4.40 cm      LV e' medial:    6.20 cm/s LV PW:         0.60 cm      LV E/e' medial:  14.4 LV IVS:  0.60 cm      LV e' lateral:   7.72 cm/s LVOT diam:     1.90 cm      LV E/e' lateral: 11.6 LV SV:         21 LV SV Index:   12 LVOT Area:     2.84 cm  LV Volumes (MOD) LV vol d, MOD A2C: 130.0 ml LV vol d, MOD A4C: 125.0 ml LV vol s, MOD A2C: 87.7 ml LV vol s, MOD A4C: 69.2 ml LV SV MOD A2C:     42.3 ml LV SV MOD A4C:     125.0 ml LV SV MOD BP:      52.6 ml RIGHT VENTRICLE            IVC RV Basal diam:  3.10 cm    IVC diam: 1.40 cm RV Mid diam:    3.30 cm RV S prime:     7.83 cm/s TAPSE (M-mode): 1.1 cm LEFT ATRIUM           Index        RIGHT ATRIUM          Index LA diam:      4.10 cm 2.37 cm/m   RA Area:     7.23 cm LA Vol (A2C): 63.1 ml 36.50 ml/m  RA Volume:   12.00 ml 6.94 ml/m LA Vol (A4C): 57.0 ml 32.97 ml/m  AORTIC VALVE                    PULMONIC VALVE AV Area (Vmax):    1.34 cm     PV Vmax:          0.76 m/s AV Area (Vmean):   1.27 cm     PV Peak grad:     2.3 mmHg AV Area (VTI):     1.66 cm     PR End Diast Vel: 2.41 msec AV Vmax:           89.90 cm/s AV Vmean:          63.400 cm/s AV VTI:            0.127 m AV Peak Grad:      3.2 mmHg AV Mean Grad:      2.0 mmHg LVOT Vmax:         42.50 cm/s LVOT Vmean:        28.300 cm/s LVOT VTI:          0.074 m LVOT/AV VTI ratio: 0.58  AORTA Ao Root diam: 2.80 cm Ao Asc diam:  3.10 cm MITRAL VALVE MV Area (PHT): 6.07 cm      SHUNTS MV Area VTI:   1.32 cm      Systemic VTI:  0.07 m MV Peak grad:  4.0 mmHg      Systemic Diam: 1.90 cm MV Mean grad:  1.0 mmHg MV Vmax:       1.00 m/s MV Vmean:      50.8 cm/s MV Decel Time: 125 msec MR Peak grad:   75.9 mmHg MR Mean grad:   48.0 mmHg MR Vmax:        435.50 cm/s MR Vmean:       324.5 cm/s MR PISA:        1.57 cm MR PISA Radius: 0.50 cm MV E velocity: 89.20 cm/s  Jules Oar MD Electronically signed by Jules Oar MD Signature Date/Time: 02/07/2024/6:51:14 PM    Final (Updated)    CT  Angio Chest PE W and/or Wo Contrast Result Date: 02/06/2024 CLINICAL DATA:  Dyspnea. EXAM: CT ANGIOGRAPHY CHEST WITH CONTRAST TECHNIQUE: Multidetector CT imaging of the chest was performed using the standard protocol during bolus administration of intravenous contrast. Multiplanar CT image reconstructions and MIPs were obtained to evaluate the vascular anatomy. RADIATION DOSE REDUCTION: This exam was performed according to the departmental dose-optimization program which includes automated exposure control, adjustment of the mA and/or kV according to patient size and/or use of iterative reconstruction technique. CONTRAST:  65mL OMNIPAQUE  IOHEXOL  350 MG/ML SOLN COMPARISON:  None Available. FINDINGS: Cardiovascular: There is marked severity calcification of the thoracic aorta without evidence of aortic aneurysm. Satisfactory opacification of the pulmonary arteries to the segmental level. No evidence of pulmonary embolism. Normal heart size with mild coronary artery calcification. No pericardial effusion. Mediastinum/Nodes: No enlarged mediastinal, hilar, or axillary lymph nodes. The thyroid gland and trachea demonstrate no significant findings. Mild diffuse esophageal wall thickening is seen. Lungs/Pleura: Mild posterior right apical and anteromedial right upper lobe linear scarring and/or atelectasis is seen. Small bilateral pleural effusions are noted, right slightly greater than left. No pneumothorax is identified. Upper Abdomen: There is a small to moderate sized hiatal hernia. Musculoskeletal: Multilevel degenerative changes seen throughout the thoracic spine. Review of the MIP images confirms the above findings. IMPRESSION: 1. No evidence of pulmonary embolism. 2. Small bilateral pleural effusions, right slightly greater than left. 3. Mild posterior right apical and anteromedial  right upper lobe linear scarring and/or atelectasis. 4. Small to moderate sized hiatal hernia. 5. Mild diffuse esophageal wall thickening which may represent esophagitis. 6. Aortic atherosclerosis. Electronically Signed   By: Virgle Grime M.D.   On: 02/06/2024 21:14   DG Chest 2 View Result Date: 02/06/2024 CLINICAL DATA:  Shortness of breath EXAM: CHEST - 2 VIEW COMPARISON:  None Available. FINDINGS: The heart size and mediastinal contours are within normal limits. Both lungs are clear. The visualized skeletal structures notable for degenerative changes. Minimal blunting of the costophrenic angles which may represent chronic scarring. IMPRESSION: 1. No definite plain film evidence of acute process. Electronically Signed   By: Reagan Camera M.D.   On: 02/06/2024 14:46        Scheduled Meds:  empagliflozin   10 mg Oral Daily   enoxaparin  (LOVENOX ) injection  40 mg Subcutaneous Q24H   furosemide   40 mg Intravenous Daily   insulin  aspart  0-15 Units Subcutaneous TID WC   rosuvastatin   40 mg Oral Daily   spironolactone   12.5 mg Oral Daily   Continuous Infusions:        Audria Leather, MD [facility] 02/08/2024, 7:49 AM

## 2024-02-08 NOTE — Plan of Care (Signed)
   Problem: Education: Goal: Ability to describe self-care measures that may prevent or decrease complications (Diabetes Survival Skills Education) will improve Outcome: Progressing

## 2024-02-09 DIAGNOSIS — I10 Essential (primary) hypertension: Secondary | ICD-10-CM | POA: Diagnosis not present

## 2024-02-09 DIAGNOSIS — N179 Acute kidney failure, unspecified: Secondary | ICD-10-CM | POA: Diagnosis not present

## 2024-02-09 DIAGNOSIS — I509 Heart failure, unspecified: Secondary | ICD-10-CM | POA: Diagnosis not present

## 2024-02-09 DIAGNOSIS — I429 Cardiomyopathy, unspecified: Secondary | ICD-10-CM | POA: Diagnosis not present

## 2024-02-09 LAB — GLUCOSE, CAPILLARY
Glucose-Capillary: 114 mg/dL — ABNORMAL HIGH (ref 70–99)
Glucose-Capillary: 196 mg/dL — ABNORMAL HIGH (ref 70–99)
Glucose-Capillary: 139 mg/dL — ABNORMAL HIGH (ref 70–99)
Glucose-Capillary: 274 mg/dL — ABNORMAL HIGH (ref 70–99)

## 2024-02-09 LAB — BASIC METABOLIC PANEL WITH GFR
Anion gap: 11 (ref 5–15)
BUN: 46 mg/dL — ABNORMAL HIGH (ref 8–23)
CO2: 24 mmol/L (ref 22–32)
Calcium: 9.1 mg/dL (ref 8.9–10.3)
Chloride: 103 mmol/L (ref 98–111)
Creatinine, Ser: 1.86 mg/dL — ABNORMAL HIGH (ref 0.61–1.24)
GFR, Estimated: 38 mL/min — ABNORMAL LOW (ref >60)
Glucose, Bld: 117 mg/dL — ABNORMAL HIGH (ref 70–99)
Potassium: 4.1 mmol/L (ref 3.5–5.1)
Sodium: 138 mmol/L (ref 135–145)

## 2024-02-09 MED ORDER — DM-GUAIFENESIN ER 30-600 MG PO TB12
1.0000 | ORAL_TABLET | Freq: Two times a day (BID) | ORAL | Status: DC
  Administered 2024-02-09 – 2024-02-11 (×4): 1 via ORAL
  Filled 2024-02-09 (×5): qty 1

## 2024-02-09 MED ORDER — METOPROLOL SUCCINATE ER 25 MG PO TB24
25.0000 mg | ORAL_TABLET | Freq: Every day | ORAL | Status: DC
  Administered 2024-02-09 – 2024-02-11 (×2): 25 mg via ORAL
  Filled 2024-02-09 (×3): qty 1

## 2024-02-09 MED ORDER — MENTHOL 3 MG MT LOZG
1.0000 | LOZENGE | OROMUCOSAL | Status: DC | PRN
  Filled 2024-02-09: qty 9

## 2024-02-09 NOTE — Plan of Care (Signed)
 ?  Problem: Elimination: ?Goal: Will not experience complications related to urinary retention ?Outcome: Progressing ?  ?

## 2024-02-09 NOTE — Progress Notes (Signed)
 Progress Note  Patient Name: Cody [name] Date of Encounter: 02/09/2024 [facility] Cardiologist: None   RE: CHF evaluation   Interval Summary    Denies anginal chest pain or heart failure symptoms. Accompanied by his brother and sister-in-law at bedside  Vital Signs Vitals:   02/08/24 2331 02/09/24 0329 02/09/24 0721 02/09/24 1127  BP: 106/73 101/62 101/75 117/74  Pulse: 93 81 85 96  Resp: 18 18 20 20   Temp: 97.9 F (36.6 C)  97.6 F (36.4 C) 97.7 F (36.5 C)  TempSrc: Oral  Oral Oral  SpO2: 98% 99% 99% 96%  Weight:  58.6 kg    Height:        Intake/Output Summary (Last 24 hours) at 02/09/2024 1148 Last data filed at 02/09/2024 8119 Gross per 24 hour  Intake 600 ml  Output 1325 ml  Net -725 ml   Net IO Since Admission: -1,305 mL [02/09/24 1148]      02/09/2024    3:29 AM 02/08/2024    4:50 AM 02/06/2024   12:38 PM  Last 3 Weights  Weight (lbs) 129 lb 3 oz 130 lb 15.3 oz 142 lb  Weight (kg) 58.6 kg 59.4 kg 64.411 kg      Telemetry/ECG  Sinus rhythm without ectopy- Personally Reviewed  Physical Exam  GEN: No acute distress.   Neck: No JVD Cardiac: RRR, no murmurs, rubs, or gallops.  Respiratory: Clear to auscultation bilaterally. GI: Soft, nontender, non-distended  MS: No edema  Assessment & Plan  73 y.o. male with a hx of hypertension, hyperlipidemia, T2DM, neuropathy, esophageal stricture and memory impairment who is being seen  for the evaluation of new heart failure at the request of hospital medicine.   Shortness of breath: Significantly improved. Likely precipitated by stopping his medications for at least 2 months. Clinical findings suggestive of congestive heart failure, echocardiogram illustrates reduced LVEF. D-dimers within normal limits.  Acute heart failure with reduced ejection fraction Cardiomyopathy LVEF 30-35%. Continue Jardiance  10 mg p.o. daily. Lasix  was yesterday decreased to 20 mg IV push daily, will hold for now as he is  overall euvolemic on physical examination and given his renal function and planned heart catheterization tomorrow. Patient is not in acute decompensated heart failure anymore based on physical examination. Will start low-dose Toprol-XL 25 mg p.o. daily. Will hold off on up titration of GDMT given the acute kidney injury clinic Given the new onset of cardiomyopathy, regional wall motion abnormalities, recommended left and right heart catheterization. With the patient's consent discussed his echo findings with brother and sister-in-law (retired Designer, jewellery).  Discussed the risks, benefits, alternatives to left and right heart catheterization and its indication.  Their questions and concerns addressed to their satisfaction.  All in agreement with proceeding forward with left and right heart catheterization with possible intervention.  This will be based on his renal function tomorrow morning 02/10/2024.  Informed Consent   Shared Decision Making/Informed Consent The risks [stroke (1 in 1000), death (1 in 1000), kidney failure [usually temporary] (1 in 500), bleeding (1 in 200), allergic reaction [possibly serious] (1 in 200)], benefits (diagnostic support and management of coronary artery disease) and alternatives of a left and right cardiac catheterization were discussed in detail with Cody [name] and he is willing to proceed.     Moderate to severe mitral regurgitation: Likely functional.  Focus on diuresis and up titration of GDMT.  Once optimized we will need to wean echocardiogram to follow disease progression.  DM: Reemphasized importance of  glycemic control, management per primary team  HTN: Blood pressures are well-controlled. Medication changes as discussed above.  HLD: Added Crestor  40mg  po qday this hospitalization.   Esophageal stricture: Management per primary team  Memory impairment: Management per primary team.  Family states that he lives independently and they do help him at  times as needed.  For questions or updates, please contact [facility] Please consult [email or web address] for contact info under       Signed, Olinda Bertrand, DO, [facility]  A Division of [facility] [address], [place], [place] [address]  [place], [place] [id number]

## 2024-02-09 NOTE — Plan of Care (Signed)
   Problem: Clinical Measurements: Goal: Will remain free from infection Outcome: Progressing   Problem: Clinical Measurements: Goal: Diagnostic test results will improve Outcome: Progressing   Problem: Clinical Measurements: Goal: Respiratory complications will improve Outcome: Progressing

## 2024-02-09 NOTE — Progress Notes (Signed)
 Mobility Specialist Progress Note:    02/09/24 1000  Mobility  Activity Ambulated with assistance in hallway  Level of Assistance Contact guard assist, steadying assist  Assistive Device Front wheel walker  Distance Ambulated (ft) 150 ft  Activity Response Tolerated well  Mobility Referral Yes  Mobility visit 1 Mobility  Mobility Specialist Start Time (ACUTE ONLY) 0945  Mobility Specialist Stop Time (ACUTE ONLY) 1000  Mobility Specialist Time Calculation (min) (ACUTE ONLY) 15 min   Received pt in bed having no complaints and agreeable to mobility. Pt was asymptomatic throughout ambulation and returned to room w/o fault. Left in bed w/ call bell in reach and all needs met.   Inetta Manes Mobility Specialist  Please contact vis Secure Chat or  Rehab Office [phone]

## 2024-02-09 NOTE — Progress Notes (Signed)
 PROGRESS NOTE    Cody [name]  KGM:[id number] DOB: December 31, 1950 DOA: 02/06/2024 PCP: Pcp, No   Brief Narrative:   73 y.o. male with medical history significant for HTN, HLD, T2DM, diabetic neuropathy, depression and memory impairment presented with worsening shortness of breath.  On presentation, he was on room air, creatinine of 1.34, BNP of 1044, high sensitive troponins of 81 and 95.  EKG was nonischemic.  Chest x-ray showed no acute cardiopulmonary disease.  CTA chest was negative for PE but showed bilateral small pleural effusions.  He was started on IV Lasix .  Cardiology was consulted.  2D echo showed EF of 30 to 35% with severe mitral regurgitation.  Assessment & Plan:   Possible acute systolic heart failure Severe mitral regurgitation Elevated troponin: Possibly from demand ischemia -Presented with worsening shortness of breath with elevated BNP and bilateral pleural effusions on imaging. - Strict input and output.  Daily weights.  Fluid restriction.  Negative balance of 1305 cc since admission. - 2D echo showed EF of 30 to 35% and severe mitral regurgitation.  Cardiology following.  Cardiology started the patient on Jardiance  and spironolactone .  Continue IV Lasix .  Cardiology is planning for cardiac catheterization possibly tomorrow.  AKI on possible CKD stage IIIa -Creatinine 1.34 on presentation and 1.86 today.  No basic recent labs available.  Patient possibly has CKD -Monitor with diuresis.  Diabetes mellitus type 2 - Continue CBGs with SSI.  Carb modified diet.   A1c 6.7.  Hypertension Hyperlipidemia Noncompliance to medications --blood pressure on the lower side.   - Patient is noncompliant with medications at home including statin.  Outpatient follow-up with PCP - Cardiology has started the patient on Crestor .  Memory impairment - Currently alert awake oriented x 3.  Has history of mild cognitive impairment.  Outpatient follow-up with PCP and/or neurology  DVT  prophylaxis: Lovenox  Code Status: DNR Family Communication: None at bedside Disposition Plan: Status is: Inpatient Remains inpatient appropriate because: Of severity of illness  Consultants: Cardiology  Procedures: 2D echo Antimicrobials: None   Subjective: Patient seen and examined at bedside.  Poor historian.  No chest pain, fever, vomiting Reported.   Objective: Vitals:   02/08/24 1928 02/08/24 2331 02/09/24 0329 02/09/24 0721  BP: (!) 94/55 106/73 101/62 101/75  Pulse: 84 93 81 85  Resp: 18 18 18 20   Temp: 98.2 F (36.8 C) 97.9 F (36.6 C)  97.6 F (36.4 C)  TempSrc: Oral Oral  Oral  SpO2: 100% 98% 99% 99%  Weight:   58.6 kg   Height:        Intake/Output Summary (Last 24 hours) at 02/09/2024 0805 Last data filed at 02/09/2024 6440 Gross per 24 hour  Intake 720 ml  Output 2045 ml  Net -1325 ml   Filed Weights   02/06/24 1238 02/08/24 0450 02/09/24 0329  Weight: 64.4 kg 59.4 kg 58.6 kg    Examination:  General: No acute distress.  Remains on room air. ENT/neck: No JVD elevation or palpable neck masses noted respiratory: Mildly decreased breath sounds at bases with scattered crackles CVS: Rate mostly controlled; S1 and S2 are heard  abdominal: Soft, nontender, distended mildly; no organomegaly, bowel sounds are heard  extremities: No clubbing; Mild lower extremity edema present  CNS: Awake and remains slow to respond.  Poor historian.  No focal neurologic deficit.  Able to move extremities Lymph: No obvious palpable lymphadenopathy Skin: No obvious petechiae/rashes psych: Affect is mostly flat.  No signs of agitation. Musculoskeletal: No obvious  joint erythema/tenderness     Data Reviewed: I have personally reviewed following labs and imaging studies  CBC: Recent Labs  Lab 02/06/24 1545 02/07/24 0200 02/08/24 0313  WBC 8.0 9.2 7.8  NEUTROABS  --   --  4.8  HGB 13.7 14.4 13.1  HCT 42.9 42.9 38.5*  MCV 93.7 90.1 88.7  PLT 280 315 265   Basic  Metabolic Panel: Recent Labs  Lab 02/06/24 1545 02/07/24 0200 02/08/24 0313 02/09/24 0326  NA 139 140 139 138  K 4.3 4.3 3.9 4.1  CL 107 105 106 103  CO2 20* 22 24 24   GLUCOSE 144* 124* 109* 117*  BUN 28* 28* 36* 46*  CREATININE 1.34* 1.46* 1.73* 1.86*  CALCIUM  9.4 9.7 9.1 9.1  MG  --   --  1.8  --    GFR: Estimated Creatinine Clearance: 29.3 mL/min (A) (by C-G formula based on SCr of 1.86 mg/dL (H)). Liver Function Tests: No results for input(s): "AST", "ALT", "ALKPHOS", "BILITOT", "PROT", "ALBUMIN" in the last 168 hours. No results for input(s): "LIPASE", "AMYLASE" in the last 168 hours. Recent Labs  Lab 02/08/24 0313  AMMONIA 27   Coagulation Profile: No results for input(s): "INR", "PROTIME" in the last 168 hours. Cardiac Enzymes: No results for input(s): "CKTOTAL", "CKMB", "CKMBINDEX", "TROPONINI" in the last 168 hours. BNP (last 3 results) No results for input(s): "PROBNP" in the last 8760 hours. HbA1C: Recent Labs    02/08/24 0313  HGBA1C 6.7*   CBG: Recent Labs  Lab 02/08/24 0625 02/08/24 1159 02/08/24 1640 02/08/24 2114 02/09/24 0622  GLUCAP 115* 123* 133* 171* 114*   Lipid Profile: Recent Labs    02/07/24 0200  CHOL 93  HDL 38*  LDLCALC 20  TRIG 295*  CHOLHDL 2.4   Thyroid Function Tests: Recent Labs    02/08/24 0313  TSH 5.591*   Anemia Panel: Recent Labs    02/08/24 0313  VITAMINB12 416   Sepsis Labs: No results for input(s): "PROCALCITON", "LATICACIDVEN" in the last 168 hours.  Recent Results (from the past 240 hours)  SARS Coronavirus 2 by RT PCR (hospital order, performed in [facility] hospital lab) *cepheid single result test* Anterior Nasal Swab     Status: None   Collection Time: 02/06/24  3:56 PM   Specimen: Anterior Nasal Swab  Result Value Ref Range Status   SARS Coronavirus 2 by RT PCR NEGATIVE NEGATIVE Final    Comment: Performed at [facility], [address], [place], [place] [address]          Radiology Studies: ECHOCARDIOGRAM COMPLETE Result Date: 02/07/2024    ECHOCARDIOGRAM REPORT   Patient Name:   Cody [name] Date of Exam: 02/07/2024 Medical Rec #:  [id number]   Height:       66.0 in Accession #:    [id number]  Weight:       142.0 lb Date of Birth:  1950-11-10   BSA:          1.729 m Patient Age:    72 years    BP:           140/111 mmHg Patient Gender: M           HR:           99 bpm. Exam Location:  Inpatient Procedure: 2D Echo, 3D Echo, Cardiac Doppler, Color Doppler and Strain Analysis            (Both Spectral and Color Flow Doppler were utilized during  procedure). Indications:     Dyspnea  History:         Patient has no prior history of Echocardiogram examinations.                  Risk Factors:Hypertension and Diabetes.  Sonographer:     Aldon Ambrosia Referring Phys:  [id number] PROSPER M AMPONSAH Diagnosing Phys: Jules Oar MD IMPRESSIONS  1. Left ventricular ejection fraction, by estimation, is 30 to 35%. The left ventricle has moderately decreased function. The left ventricle demonstrates global hypokinesis with inferior and inferolateral akinesis. Left ventricular diastolic parameters are indeterminate. There is the interventricular septum is flattened in systole, consistent with right ventricular pressure overload.  2. Right ventricular systolic function is moderately reduced. The right ventricular size is normal. Tricuspid regurgitation signal is inadequate for assessing PA pressure.  3. Left atrial size was moderately dilated.  4. The mitral valve is normal in structure. Severe mitral valve regurgitation. No evidence of mitral stenosis.  5. The aortic valve is tricuspid. There is mild calcification of the aortic valve. Aortic valve regurgitation is not visualized. Aortic valve sclerosis/calcification is present, without any evidence of aortic stenosis.  6. The inferior vena cava is normal in size with greater than 50% respiratory variability, suggesting right  atrial pressure of 3 mmHg. FINDINGS  Left Ventricle: Left ventricular ejection fraction, by estimation, is 30 to 35%. The left ventricle has moderately decreased function. The left ventricle demonstrates global hypokinesis. The left ventricular internal cavity size was normal in size. There is no left ventricular hypertrophy. The interventricular septum is flattened in systole, consistent with right ventricular pressure overload. Left ventricular diastolic parameters are indeterminate.  LV Wall Scoring: The inferior wall and posterior wall are akinetic. Right Ventricle: The right ventricular size is normal. No increase in right ventricular wall thickness. Right ventricular systolic function is moderately reduced. Tricuspid regurgitation signal is inadequate for assessing PA pressure. Left Atrium: Left atrial size was moderately dilated. Right Atrium: Right atrial size was normal in size. Pericardium: There is no evidence of pericardial effusion. Mitral Valve: The mitral valve is normal in structure. Severe mitral valve regurgitation, with centrally-directed jet. No evidence of mitral valve stenosis. MV peak gradient, 4.0 mmHg. The mean mitral valve gradient is 1.0 mmHg. Tricuspid Valve: The tricuspid valve is normal in structure. Tricuspid valve regurgitation is mild . No evidence of tricuspid stenosis. Aortic Valve: The aortic valve is tricuspid. There is mild calcification of the aortic valve. Aortic valve regurgitation is not visualized. Aortic valve sclerosis/calcification is present, without any evidence of aortic stenosis. Aortic valve mean gradient measures 2.0 mmHg. Aortic valve peak gradient measures 3.2 mmHg. Aortic valve area, by VTI measures 1.66 cm. Pulmonic Valve: The pulmonic valve was normal in structure. Pulmonic valve regurgitation is trivial. No evidence of pulmonic stenosis. Aorta: The aortic root is normal in size and structure. Venous: The inferior vena cava is normal in size with greater than  50% respiratory variability, suggesting right atrial pressure of 3 mmHg. IAS/Shunts: No atrial level shunt detected by color flow Doppler.  LEFT VENTRICLE PLAX 2D LVIDd:         5.40 cm      Diastology LVIDs:         4.40 cm      LV e' medial:    6.20 cm/s LV PW:         0.60 cm      LV E/e' medial:  14.4 LV IVS:  0.60 cm      LV e' lateral:   7.72 cm/s LVOT diam:     1.90 cm      LV E/e' lateral: 11.6 LV SV:         21 LV SV Index:   12 LVOT Area:     2.84 cm  LV Volumes (MOD) LV vol d, MOD A2C: 130.0 ml LV vol d, MOD A4C: 125.0 ml LV vol s, MOD A2C: 87.7 ml LV vol s, MOD A4C: 69.2 ml LV SV MOD A2C:     42.3 ml LV SV MOD A4C:     125.0 ml LV SV MOD BP:      52.6 ml RIGHT VENTRICLE            IVC RV Basal diam:  3.10 cm    IVC diam: 1.40 cm RV Mid diam:    3.30 cm RV S prime:     7.83 cm/s TAPSE (M-mode): 1.1 cm LEFT ATRIUM           Index        RIGHT ATRIUM          Index LA diam:      4.10 cm 2.37 cm/m   RA Area:     7.23 cm LA Vol (A2C): 63.1 ml 36.50 ml/m  RA Volume:   12.00 ml 6.94 ml/m LA Vol (A4C): 57.0 ml 32.97 ml/m  AORTIC VALVE                    PULMONIC VALVE AV Area (Vmax):    1.34 cm     PV Vmax:          0.76 m/s AV Area (Vmean):   1.27 cm     PV Peak grad:     2.3 mmHg AV Area (VTI):     1.66 cm     PR End Diast Vel: 2.41 msec AV Vmax:           89.90 cm/s AV Vmean:          63.400 cm/s AV VTI:            0.127 m AV Peak Grad:      3.2 mmHg AV Mean Grad:      2.0 mmHg LVOT Vmax:         42.50 cm/s LVOT Vmean:        28.300 cm/s LVOT VTI:          0.074 m LVOT/AV VTI ratio: 0.58  AORTA Ao Root diam: 2.80 cm Ao Asc diam:  3.10 cm MITRAL VALVE MV Area (PHT): 6.07 cm      SHUNTS MV Area VTI:   1.32 cm      Systemic VTI:  0.07 m MV Peak grad:  4.0 mmHg      Systemic Diam: 1.90 cm MV Mean grad:  1.0 mmHg MV Vmax:       1.00 m/s MV Vmean:      50.8 cm/s MV Decel Time: 125 msec MR Peak grad:   75.9 mmHg MR Mean grad:   48.0 mmHg MR Vmax:        435.50 cm/s MR Vmean:       324.5 cm/s MR  PISA:        1.57 cm MR PISA Radius: 0.50 cm MV E velocity: 89.20 cm/s Jules Oar MD Electronically signed by Jules Oar MD Signature Date/Time: 02/07/2024/6:51:14 PM    Final (Updated)  Scheduled Meds:  empagliflozin   10 mg Oral Daily   enoxaparin  (LOVENOX ) injection  40 mg Subcutaneous Q24H   furosemide   20 mg Intravenous Daily   insulin  aspart  0-15 Units Subcutaneous TID WC   rosuvastatin   40 mg Oral Daily   Continuous Infusions:        Audria Leather, MD [facility] 02/09/2024, 8:05 AM

## 2024-02-09 NOTE — Plan of Care (Signed)

## 2024-02-10 ENCOUNTER — Encounter (HOSPITAL_COMMUNITY): Payer: Self-pay | Admitting: Internal Medicine

## 2024-02-10 ENCOUNTER — Encounter (HOSPITAL_COMMUNITY)
Admission: EM | Disposition: A | Discharge status: Home / Self Care | Payer: Self-pay | Source: Home / Self Care | Attending: Internal Medicine

## 2024-02-10 DIAGNOSIS — I509 Heart failure, unspecified: Secondary | ICD-10-CM | POA: Diagnosis not present

## 2024-02-10 DIAGNOSIS — I5041 Acute combined systolic (congestive) and diastolic (congestive) heart failure: Secondary | ICD-10-CM

## 2024-02-10 HISTORY — PX: RIGHT/LEFT HEART CATH AND CORONARY ANGIOGRAPHY: CATH118266

## 2024-02-10 LAB — POCT I-STAT EG7
Acid-base deficit: 3 mmol/L — ABNORMAL HIGH (ref 0.0–2.0)
Acid-base deficit: 3 mmol/L — ABNORMAL HIGH (ref 0.0–2.0)
Bicarbonate: 22.1 mmol/L (ref 20.0–28.0)
Bicarbonate: 22.5 mmol/L (ref 20.0–28.0)
Calcium, Ion: 1.11 mmol/L — ABNORMAL LOW (ref 1.15–1.40)
Calcium, Ion: 1.13 mmol/L — ABNORMAL LOW (ref 1.15–1.40)
HCT: 36 % — ABNORMAL LOW (ref 39.0–52.0)
HCT: 36 % — ABNORMAL LOW (ref 39.0–52.0)
Hemoglobin: 12.2 g/dL — ABNORMAL LOW (ref 13.0–17.0)
Hemoglobin: 12.2 g/dL — ABNORMAL LOW (ref 13.0–17.0)
O2 Saturation: 60 %
O2 Saturation: 64 %
Potassium: 3.9 mmol/L (ref 3.5–5.1)
Potassium: 4 mmol/L (ref 3.5–5.1)
Sodium: 135 mmol/L (ref 135–145)
Sodium: 139 mmol/L (ref 135–145)
TCO2: 23 mmol/L (ref 22–32)
TCO2: 24 mmol/L (ref 22–32)
pCO2, Ven: 38.8 mmHg — ABNORMAL LOW (ref 44–60)
pCO2, Ven: 40.6 mmHg — ABNORMAL LOW (ref 44–60)
pH, Ven: 7.351 (ref 7.25–7.43)
pH, Ven: 7.363 (ref 7.25–7.43)
pO2, Ven: 33 mmHg (ref 32–45)
pO2, Ven: 34 mmHg (ref 32–45)

## 2024-02-10 LAB — POCT I-STAT 7, (LYTES, BLD GAS, ICA,H+H)
Acid-base deficit: 3 mmol/L — ABNORMAL HIGH (ref 0.0–2.0)
Bicarbonate: 20.9 mmol/L (ref 20.0–28.0)
Calcium, Ion: 1.15 mmol/L (ref 1.15–1.40)
HCT: 36 % — ABNORMAL LOW (ref 39.0–52.0)
Hemoglobin: 12.2 g/dL — ABNORMAL LOW (ref 13.0–17.0)
O2 Saturation: 98 %
Potassium: 4.2 mmol/L (ref 3.5–5.1)
Sodium: 139 mmol/L (ref 135–145)
TCO2: 22 mmol/L (ref 22–32)
pCO2 arterial: 31.8 mmHg — ABNORMAL LOW (ref 32–48)
pH, Arterial: 7.426 (ref 7.35–7.45)
pO2, Arterial: 104 mmHg (ref 83–108)

## 2024-02-10 LAB — BASIC METABOLIC PANEL WITH GFR
Anion gap: 12 (ref 5–15)
BUN: 43 mg/dL — ABNORMAL HIGH (ref 8–23)
CO2: 23 mmol/L (ref 22–32)
Calcium: 8.6 mg/dL — ABNORMAL LOW (ref 8.9–10.3)
Chloride: 102 mmol/L (ref 98–111)
Creatinine, Ser: 1.66 mg/dL — ABNORMAL HIGH (ref 0.61–1.24)
GFR, Estimated: 43 mL/min — ABNORMAL LOW (ref >60)
Glucose, Bld: 103 mg/dL — ABNORMAL HIGH (ref 70–99)
Potassium: 3.8 mmol/L (ref 3.5–5.1)
Sodium: 137 mmol/L (ref 135–145)

## 2024-02-10 LAB — GLUCOSE, CAPILLARY
Glucose-Capillary: 102 mg/dL — ABNORMAL HIGH (ref 70–99)
Glucose-Capillary: 145 mg/dL — ABNORMAL HIGH (ref 70–99)
Glucose-Capillary: 111 mg/dL — ABNORMAL HIGH (ref 70–99)
Glucose-Capillary: 211 mg/dL — ABNORMAL HIGH (ref 70–99)

## 2024-02-10 LAB — MAGNESIUM: Magnesium: 1.7 mg/dL (ref 1.7–2.4)

## 2024-02-10 SURGERY — RIGHT/LEFT HEART CATH AND CORONARY ANGIOGRAPHY
Anesthesia: LOCAL

## 2024-02-10 MED ORDER — HEPARIN (PORCINE) IN NACL 1000-0.9 UT/500ML-% IV SOLN
INTRAVENOUS | Status: DC | PRN
  Administered 2024-02-10 (×2): 500 mL

## 2024-02-10 MED ORDER — LIDOCAINE HCL (PF) 1 % IJ SOLN
INTRAMUSCULAR | Status: AC
Start: 2024-02-10 — End: ?
  Filled 2024-02-10: qty 30

## 2024-02-10 MED ORDER — FENTANYL CITRATE (PF) 100 MCG/2ML IJ SOLN
INTRAMUSCULAR | Status: DC | PRN
  Administered 2024-02-10 (×2): 25 ug via INTRAVENOUS

## 2024-02-10 MED ORDER — FENTANYL CITRATE (PF) 100 MCG/2ML IJ SOLN
INTRAMUSCULAR | Status: AC
  Filled 2024-02-10: qty 2

## 2024-02-10 MED ORDER — VERAPAMIL HCL 2.5 MG/ML IV SOLN
INTRAVENOUS | Status: AC
  Filled 2024-02-10: qty 2

## 2024-02-10 MED ORDER — MIDAZOLAM HCL 2 MG/2ML IJ SOLN
INTRAMUSCULAR | Status: DC | PRN
  Administered 2024-02-10 (×2): 1 mg via INTRAVENOUS

## 2024-02-10 MED ORDER — SODIUM CHLORIDE 0.9 % IV SOLN: INTRAVENOUS | Status: DC

## 2024-02-10 MED ORDER — LIDOCAINE HCL (PF) 1 % IJ SOLN
INTRAMUSCULAR | Status: DC | PRN
  Administered 2024-02-10: 5 mL via INTRADERMAL

## 2024-02-10 MED ORDER — HEPARIN SODIUM (PORCINE) 1000 UNIT/ML IJ SOLN
INTRAMUSCULAR | Status: AC
Start: 2024-02-10 — End: ?
  Filled 2024-02-10: qty 10

## 2024-02-10 MED ORDER — VERAPAMIL HCL 2.5 MG/ML IV SOLN
INTRAVENOUS | Status: DC | PRN
  Administered 2024-02-10: 10 mL via INTRA_ARTERIAL

## 2024-02-10 MED ORDER — IOHEXOL 350 MG/ML SOLN
INTRAVENOUS | Status: DC | PRN
  Administered 2024-02-10: 42 mL

## 2024-02-10 MED ORDER — ASPIRIN 81 MG PO TBEC
81.0000 mg | DELAYED_RELEASE_TABLET | Freq: Every day | ORAL | Status: DC
  Administered 2024-02-10 – 2024-02-11 (×2): 81 mg via ORAL
  Filled 2024-02-10 (×2): qty 1

## 2024-02-10 MED ORDER — MIDAZOLAM HCL 2 MG/2ML IJ SOLN
INTRAMUSCULAR | Status: AC
  Filled 2024-02-10: qty 2

## 2024-02-10 MED ORDER — HEPARIN SODIUM (PORCINE) 1000 UNIT/ML IJ SOLN
INTRAMUSCULAR | Status: DC | PRN
  Administered 2024-02-10: 5000 [IU] via INTRAVENOUS

## 2024-02-10 SURGICAL SUPPLY — 10 items
CATH BALLN WEDGE 5F 110CM (CATHETERS) IMPLANT
CATH INFINITI AMBI 6FR TG (CATHETERS) IMPLANT
COVER DOME SNAP 22 D (MISCELLANEOUS) IMPLANT
DEVICE RAD COMP TR BAND LRG (VASCULAR PRODUCTS) IMPLANT
GLIDESHEATH SLEND SS 6F .021 (SHEATH) IMPLANT
GUIDEWIRE .025 260CM (WIRE) IMPLANT
PACK CARDIAC CATHETERIZATION (CUSTOM PROCEDURE TRAY) ×1 IMPLANT
SET ATX-X65L (MISCELLANEOUS) IMPLANT
SHEATH GLIDE SLENDER 4/5FR (SHEATH) IMPLANT
WIRE EMERALD 3MM-J .035X260CM (WIRE) IMPLANT

## 2024-02-10 NOTE — Interval H&P Note (Signed)
 History and Physical Interval Note:  02/10/2024 1:30 PM  Cody [name]  has presented today for surgery, with the diagnosis of HF.  The various methods of treatment have been discussed with the patient and family. After consideration of risks, benefits and other options for treatment, the patient has consented to  Procedure(s): RIGHT/LEFT HEART CATH AND CORONARY ANGIOGRAPHY (N/A) as a surgical intervention.  The patient's history has been reviewed, patient examined, no change in status, stable for surgery.  I have reviewed the patient's chart and labs.  Questions were answered to the patient's satisfaction.     Avynn Klassen K Alayziah Tangeman

## 2024-02-10 NOTE — Plan of Care (Addendum)
 Pt resting throughout shift. Aox4, no c/o pain. VSS. Tolerating diet, voiding w/o difficulty. Amb to BR w/ walker. Right radial site dressing clean, dry, intact. Safety precautions in place, call light within reach.   Problem: Education: Goal: Ability to describe self-care measures that may prevent or decrease complications (Diabetes Survival Skills Education) will improve Outcome: Progressing Goal: Individualized Educational Video(s) Outcome: Progressing   Problem: Coping: Goal: Ability to adjust to condition or change in health will improve Outcome: Progressing   Problem: Fluid Volume: Goal: Ability to maintain a balanced intake and output will improve Outcome: Progressing   Problem: Health Behavior/Discharge Planning: Goal: Ability to identify and utilize available resources and services will improve Outcome: Progressing Goal: Ability to manage health-related needs will improve Outcome: Progressing   Problem: Metabolic: Goal: Ability to maintain appropriate glucose levels will improve Outcome: Progressing   Problem: Nutritional: Goal: Maintenance of adequate nutrition will improve Outcome: Progressing Goal: Progress toward achieving an optimal weight will improve Outcome: Progressing   Problem: Skin Integrity: Goal: Risk for impaired skin integrity will decrease Outcome: Progressing   Problem: Tissue Perfusion: Goal: Adequacy of tissue perfusion will improve Outcome: Progressing   Problem: Education: Goal: Knowledge of General Education information will improve Description: Including pain rating scale, medication(s)/side effects and non-pharmacologic comfort measures Outcome: Progressing   Problem: Health Behavior/Discharge Planning: Goal: Ability to manage health-related needs will improve Outcome: Progressing   Problem: Clinical Measurements: Goal: Ability to maintain clinical measurements within normal limits will improve Outcome: Progressing Goal: Will  remain free from infection Outcome: Progressing Goal: Diagnostic test results will improve Outcome: Progressing Goal: Respiratory complications will improve Outcome: Progressing Goal: Cardiovascular complication will be avoided Outcome: Progressing   Problem: Activity: Goal: Risk for activity intolerance will decrease Outcome: Progressing   Problem: Nutrition: Goal: Adequate nutrition will be maintained Outcome: Progressing   Problem: Coping: Goal: Level of anxiety will decrease Outcome: Progressing   Problem: Elimination: Goal: Will not experience complications related to bowel motility Outcome: Progressing Goal: Will not experience complications related to urinary retention Outcome: Progressing   Problem: Pain Managment: Goal: General experience of comfort will improve and/or be controlled Outcome: Progressing   Problem: Safety: Goal: Ability to remain free from injury will improve Outcome: Progressing   Problem: Skin Integrity: Goal: Risk for impaired skin integrity will decrease Outcome: Progressing   Problem: Education: Goal: Ability to demonstrate management of disease process will improve Outcome: Progressing Goal: Ability to verbalize understanding of medication therapies will improve Outcome: Progressing Goal: Individualized Educational Video(s) Outcome: Progressing   Problem: Activity: Goal: Capacity to carry out activities will improve Outcome: Progressing   Problem: Cardiac: Goal: Ability to achieve and maintain adequate cardiopulmonary perfusion will improve Outcome: Progressing

## 2024-02-10 NOTE — H&P (View-Only) (Signed)
 Rounding Note    Patient Name: Cody [name] Date of Encounter: 02/10/2024  [facility] Cardiologist: [name], MD   Subjective   No acute events overnight. He feels well. Feels that he has been slowing down, but denies any chest pain. No shortness of breath at rest, has felt like he can do less activity than usual for some time. Notes that he was ordered "12 pills" by his PCP, but he decided to stop taking them. Reviewed plans for cath today. His blood pressure has been trending down, but he denies lightheadedness/dizziness.  Inpatient Medications    Scheduled Meds:  aspirin EC  81 mg Oral Daily   dextromethorphan-guaiFENesin  1 tablet Oral BID   empagliflozin   10 mg Oral Daily   enoxaparin  (LOVENOX ) injection  40 mg Subcutaneous Q24H   insulin  aspart  0-15 Units Subcutaneous TID WC   metoprolol succinate  25 mg Oral Daily   rosuvastatin   40 mg Oral Daily   Continuous Infusions:  sodium chloride      PRN Meds: acetaminophen  **OR** acetaminophen , menthol-cetylpyridinium, ondansetron  **OR** ondansetron  (ZOFRAN ) IV, senna-docusate   Vital Signs    Vitals:   02/10/24 0011 02/10/24 0513 02/10/24 0726 02/10/24 1046  BP: 109/80 94/66 95/68  (!) 87/68  Pulse: 94 82 80 90  Resp: 18 18 20 20   Temp: 97.6 F (36.4 C) 97.9 F (36.6 C) 98.6 F (37 C)   TempSrc: Oral Oral Oral   SpO2: 96% 97% 98% 98%  Weight:  58.8 kg    Height:        Intake/Output Summary (Last 24 hours) at 02/10/2024 1137 Last data filed at 02/10/2024 1042 Gross per 24 hour  Intake 240 ml  Output 1150 ml  Net -910 ml      02/10/2024    5:13 AM 02/09/2024    3:29 AM 02/08/2024    4:50 AM  Last 3 Weights  Weight (lbs) 129 lb 10.1 oz 129 lb 3 oz 130 lb 15.3 oz  Weight (kg) 58.8 kg 58.6 kg 59.4 kg      Telemetry    SR - Personally Reviewed  Physical Exam   GEN: No acute distress.   Neck: No JVD Cardiac: RRR, no rubs, or gallops. 1/6 hsm Respiratory: Clear to auscultation  bilaterally. GI: Soft, nontender, non-distended  MS: No edema; No deformity. Neuro:  Nonfocal  Psych: Normal affect   New pertinent results (labs, ECG, imaging, cardiac studies)    Echo images personally reviewed today  Assessment & Plan    Acute systolic and diastolic heart failure Shortness of breath -reports being off medications for some time prior to presentation -EF 30-35% with focal wall motion abnormalities inferior/inferolateral -planned for [facility] today, see consent from Dr. Albert Huff from 02/09/2024 -concern for CAD, started on rosuvastatin  this admission (ordered for atorvastatin  10 mg daily at home but was not taking), I started aspirin today -no true admission weight. Initial weight 59.4 kg 5/31, weight today 58.8, net negative 2.2 L -diuresis on hold with Cr bump, improved today. Will use RHC to further guide diuresis. With him being NPO, appearing euvolemic and BP trending down, will give some IV fluids prior to cath -GDMT:  -on Jardiance  10 mg daily  -on metoprolol succinate 25 mg daily now due to hypotension, was ordered carvedilol  prior to admission though was not taking  -was ordered for lisinopril  and spironolactone  prior to admission, though not taking. These are on hold due to AKI as below, and he has no blood  pressure room to add as well  Moderate to severe mitral regurgitation -on my review, appears likely moderate and secondary to LV dilation (noted as severe on report) -can be monitored as an outpatient, ideally will improve with diuresis and GDMT -of note, has esophageal stricture history, dilated 09/2023, pertinent if TEE considered in the future  Type II diabetes: on Jardiance  as above. A1c 6.7  Acute kidney injury on chronic kidney disease stage 3a -diuresis on hold as above    Signed, Sheryle Donning, MD  02/10/2024, 11:37 AM

## 2024-02-10 NOTE — Progress Notes (Signed)
 PROGRESS NOTE    Cody [name]  ZOX:[id number] DOB: 1950/10/10 DOA: 02/06/2024 PCP: Pcp, No   Brief Narrative:   73 y.o. male with medical history significant for HTN, HLD, T2DM, diabetic neuropathy, depression and memory impairment presented with worsening shortness of breath.  On presentation, he was on room air, creatinine of 1.34, BNP of 1044, high sensitive troponins of 81 and 95.  EKG was nonischemic.  Chest x-ray showed no acute cardiopulmonary disease.  CTA chest was negative for PE but showed bilateral small pleural effusions.  He was started on IV Lasix .  Cardiology was consulted.  2D echo showed EF of 30 to 35% with severe mitral regurgitation.  Assessment & Plan:   Possible acute systolic heart failure Severe mitral regurgitation Elevated troponin: Possibly from demand ischemia - Strict input and output.  Daily weights.  Fluid restriction.  Negative balance of 1815 cc since admission. - 2D echo showed EF of 30 to 35% and severe mitral regurgitation.  Cardiology started the patient on Jardiance  and spironolactone .  Diuretics as per cardiology: Lasix  on hold for now as per cardiology.  Toprol-XL has been started as well.  Cardiology is planning for cardiac catheterization possibly today  AKI on possible CKD stage IIIa -Creatinine 1.34 on presentation and 1.66 today.  No basic recent labs available.  Patient possibly has CKD -Monitor with diuresis.  Diabetes mellitus type 2 - Continue CBGs with SSI.  Carb modified diet.   A1c 6.7.  Hypertension Hyperlipidemia Noncompliance to medications --blood pressure on the lower side.   - Patient is noncompliant with medications at home including statin.  Outpatient follow-up with PCP - Cardiology has started the patient on Crestor .  Memory impairment - Currently alert awake oriented x 3.  Has history of mild cognitive impairment.  Outpatient follow-up with PCP and/or neurology  DVT prophylaxis: Lovenox  Code Status: DNR Family  Communication: None at bedside Disposition Plan: Status is: Inpatient Remains inpatient appropriate because: Of severity of illness  Consultants: Cardiology  Procedures: 2D echo Antimicrobials: None   Subjective: Patient seen and examined at bedside.  Poor historian.  No fever, vomiting, worsening shortness of breath or chest pain reported.  Objective: Vitals:   02/09/24 1952 02/10/24 0011 02/10/24 0513 02/10/24 0726  BP: 103/66 109/80 94/66 95/68   Pulse: 93 94 82 80  Resp: 18 18 18 20   Temp: 97.9 F (36.6 C) 97.6 F (36.4 C) 97.9 F (36.6 C) 98.6 F (37 C)  TempSrc: Oral Oral Oral Oral  SpO2: 100% 96% 97% 98%  Weight:   58.8 kg   Height:        Intake/Output Summary (Last 24 hours) at 02/10/2024 0737 Last data filed at 02/10/2024 0017 Gross per 24 hour  Intake 240 ml  Output 750 ml  Net -510 ml   Filed Weights   02/08/24 0450 02/09/24 0329 02/10/24 0513  Weight: 59.4 kg 58.6 kg 58.8 kg    Examination:  General: On room air currently.  No distress.  Chronically ill and deconditioned looking. ENT/neck: No palpable thyromegaly or elevated JVD noted  respiratory: Decreased breath sounds at bases bilaterally with some crackles CVS: S1-S2 heard; rate currently controlled abdominal: Soft, nontender, distended slightly; no organomegaly, bowel sounds are heard normally extremities: Bilateral lower extremity edema present; no cyanosis CNS: Alert; still slow to respond.  Poor historian.  No obvious focal deficits lymph: No palpable lymphadenopathy Skin: No obvious rashes/ecchymosis psych: Not agitated currently.  Flat affect  musculoskeletal: No obvious joint swelling/deformity  Data Reviewed: I have personally reviewed following labs and imaging studies  CBC: Recent Labs  Lab 02/06/24 1545 02/07/24 0200 02/08/24 0313  WBC 8.0 9.2 7.8  NEUTROABS  --   --  4.8  HGB 13.7 14.4 13.1  HCT 42.9 42.9 38.5*  MCV 93.7 90.1 88.7  PLT 280 315 265   Basic Metabolic  Panel: Recent Labs  Lab 02/06/24 1545 02/07/24 0200 02/08/24 0313 02/09/24 0326 02/10/24 0314  NA 139 140 139 138 137  K 4.3 4.3 3.9 4.1 3.8  CL 107 105 106 103 102  CO2 20* 22 24 24 23   GLUCOSE 144* 124* 109* 117* 103*  BUN 28* 28* 36* 46* 43*  CREATININE 1.34* 1.46* 1.73* 1.86* 1.66*  CALCIUM  9.4 9.7 9.1 9.1 8.6*  MG  --   --  1.8  --  1.7   GFR: Estimated Creatinine Clearance: 33 mL/min (A) (by C-G formula based on SCr of 1.66 mg/dL (H)). Liver Function Tests: No results for input(s): "AST", "ALT", "ALKPHOS", "BILITOT", "PROT", "ALBUMIN" in the last 168 hours. No results for input(s): "LIPASE", "AMYLASE" in the last 168 hours. Recent Labs  Lab 02/08/24 0313  AMMONIA 27   Coagulation Profile: No results for input(s): "INR", "PROTIME" in the last 168 hours. Cardiac Enzymes: No results for input(s): "CKTOTAL", "CKMB", "CKMBINDEX", "TROPONINI" in the last 168 hours. BNP (last 3 results) No results for input(s): "PROBNP" in the last 8760 hours. HbA1C: Recent Labs    02/08/24 0313  HGBA1C 6.7*   CBG: Recent Labs  Lab 02/09/24 0622 02/09/24 1123 02/09/24 1600 02/09/24 2107 02/10/24 0627  GLUCAP 114* 196* 139* 274* 102*   Lipid Profile: No results for input(s): "CHOL", "HDL", "LDLCALC", "TRIG", "CHOLHDL", "LDLDIRECT" in the last 72 hours.  Thyroid Function Tests: Recent Labs    02/08/24 0313  TSH 5.591*   Anemia Panel: Recent Labs    02/08/24 0313  VITAMINB12 416   Sepsis Labs: No results for input(s): "PROCALCITON", "LATICACIDVEN" in the last 168 hours.  Recent Results (from the past 240 hours)  SARS Coronavirus 2 by RT PCR (hospital order, performed in [facility] hospital lab) *cepheid single result test* Anterior Nasal Swab     Status: None   Collection Time: 02/06/24  3:56 PM   Specimen: Anterior Nasal Swab  Result Value Ref Range Status   SARS Coronavirus 2 by RT PCR NEGATIVE NEGATIVE Final    Comment: Performed at [facility],  [address] [place], [place] [address]         Radiology Studies: No results found.       Scheduled Meds:  dextromethorphan-guaiFENesin  1 tablet Oral BID   empagliflozin   10 mg Oral Daily   enoxaparin  (LOVENOX ) injection  40 mg Subcutaneous Q24H   insulin  aspart  0-15 Units Subcutaneous TID WC   metoprolol succinate  25 mg Oral Daily   rosuvastatin   40 mg Oral Daily   Continuous Infusions:        Audria Leather, MD [facility] 02/10/2024, 7:37 AM

## 2024-02-10 NOTE — Plan of Care (Signed)
   Problem: Fluid Volume: Goal: Ability to maintain a balanced intake and output will improve Outcome: Progressing

## 2024-02-10 NOTE — Progress Notes (Signed)
 1930: Called to help assess R radial site. Rebecca, RTCTCI, holding manuel pressure on puncture site. After 5 mins, pressure released to assess any bleeding. Site monitored for 5 minutes, no hematoma or bleeding noted. Radial pulse +1, Ulnar +2, RB B. Guaze and Tegaderm placed. Pt reeducated on puncture site precautions, including not using right hand/ wrist. During attempt at reeducation, pt continued to move right hand, so arm board placed. Cap refill < 3. Patient denied any pain or discomfort. Nurse at bedside, no further concerns at this time.

## 2024-02-10 NOTE — Progress Notes (Signed)
 Rounding Note    Patient Name: Cody [name] Date of Encounter: 02/10/2024  [facility] Cardiologist: [name], MD   Subjective   No acute events overnight. He feels well. Feels that he has been slowing down, but denies any chest pain. No shortness of breath at rest, has felt like he can do less activity than usual for some time. Notes that he was ordered "12 pills" by his PCP, but he decided to stop taking them. Reviewed plans for cath today. His blood pressure has been trending down, but he denies lightheadedness/dizziness.  Inpatient Medications    Scheduled Meds:  aspirin EC  81 mg Oral Daily   dextromethorphan-guaiFENesin  1 tablet Oral BID   empagliflozin   10 mg Oral Daily   enoxaparin  (LOVENOX ) injection  40 mg Subcutaneous Q24H   insulin  aspart  0-15 Units Subcutaneous TID WC   metoprolol succinate  25 mg Oral Daily   rosuvastatin   40 mg Oral Daily   Continuous Infusions:  sodium chloride      PRN Meds: acetaminophen  **OR** acetaminophen , menthol-cetylpyridinium, ondansetron  **OR** ondansetron  (ZOFRAN ) IV, senna-docusate   Vital Signs    Vitals:   02/10/24 0011 02/10/24 0513 02/10/24 0726 02/10/24 1046  BP: 109/80 94/66 95/68  (!) 87/68  Pulse: 94 82 80 90  Resp: 18 18 20 20   Temp: 97.6 F (36.4 C) 97.9 F (36.6 C) 98.6 F (37 C)   TempSrc: Oral Oral Oral   SpO2: 96% 97% 98% 98%  Weight:  58.8 kg    Height:        Intake/Output Summary (Last 24 hours) at 02/10/2024 1137 Last data filed at 02/10/2024 1042 Gross per 24 hour  Intake 240 ml  Output 1150 ml  Net -910 ml      02/10/2024    5:13 AM 02/09/2024    3:29 AM 02/08/2024    4:50 AM  Last 3 Weights  Weight (lbs) 129 lb 10.1 oz 129 lb 3 oz 130 lb 15.3 oz  Weight (kg) 58.8 kg 58.6 kg 59.4 kg      Telemetry    SR - Personally Reviewed  Physical Exam   GEN: No acute distress.   Neck: No JVD Cardiac: RRR, no rubs, or gallops. 1/6 hsm Respiratory: Clear to auscultation  bilaterally. GI: Soft, nontender, non-distended  MS: No edema; No deformity. Neuro:  Nonfocal  Psych: Normal affect   New pertinent results (labs, ECG, imaging, cardiac studies)    Echo images personally reviewed today  Assessment & Plan    Acute systolic and diastolic heart failure Shortness of breath -reports being off medications for some time prior to presentation -EF 30-35% with focal wall motion abnormalities inferior/inferolateral -planned for [facility] today, see consent from Dr. Albert Huff from 02/09/2024 -concern for CAD, started on rosuvastatin  this admission (ordered for atorvastatin  10 mg daily at home but was not taking), I started aspirin today -no true admission weight. Initial weight 59.4 kg 5/31, weight today 58.8, net negative 2.2 L -diuresis on hold with Cr bump, improved today. Will use RHC to further guide diuresis. With him being NPO, appearing euvolemic and BP trending down, will give some IV fluids prior to cath -GDMT:  -on Jardiance  10 mg daily  -on metoprolol succinate 25 mg daily now due to hypotension, was ordered carvedilol  prior to admission though was not taking  -was ordered for lisinopril  and spironolactone  prior to admission, though not taking. These are on hold due to AKI as below, and he has no blood  pressure room to add as well  Moderate to severe mitral regurgitation -on my review, appears likely moderate and secondary to LV dilation (noted as severe on report) -can be monitored as an outpatient, ideally will improve with diuresis and GDMT -of note, has esophageal stricture history, dilated 09/2023, pertinent if TEE considered in the future  Type II diabetes: on Jardiance  as above. A1c 6.7  Acute kidney injury on chronic kidney disease stage 3a -diuresis on hold as above    Signed, Sheryle Donning, MD  02/10/2024, 11:37 AM

## 2024-02-10 NOTE — Progress Notes (Signed)
 [facility] night cross cover note:   I was notified by the patient's RN of the patient's request to not have his evening CBG performed tonight.  He is amenable to resumption of scheduled CBG monitoring in the morning.  Per brief chart review, he has a history of type 2 diabetes mellitus, is on Jardiance  as an outpatient, and has been undergoing routine before every meal and at bedtime Accu-Cheks with sliding scale insulin  over the last 4 days during this current hospitalization.  Over the last day, his blood sugar range has been between 102 and 145, with most recent blood sugar noted to be 111.  Per the above, and per patient request, will skip this evening scheduled CBG check, with plan to resume scheduled AC and at bedtime Accu-Cheks in the morning.     Camelia Cavalier, DO Hospitalist

## 2024-02-11 ENCOUNTER — Other Ambulatory Visit (HOSPITAL_COMMUNITY): Payer: Self-pay

## 2024-02-11 DIAGNOSIS — I2510 Atherosclerotic heart disease of native coronary artery without angina pectoris: Secondary | ICD-10-CM

## 2024-02-11 DIAGNOSIS — I509 Heart failure, unspecified: Secondary | ICD-10-CM | POA: Diagnosis not present

## 2024-02-11 LAB — BASIC METABOLIC PANEL WITH GFR
Anion gap: 11 (ref 5–15)
BUN: 40 mg/dL — ABNORMAL HIGH (ref 8–23)
CO2: 23 mmol/L (ref 22–32)
Calcium: 8.7 mg/dL — ABNORMAL LOW (ref 8.9–10.3)
Chloride: 106 mmol/L (ref 98–111)
Creatinine, Ser: 1.95 mg/dL — ABNORMAL HIGH (ref 0.61–1.24)
GFR, Estimated: 36 mL/min — ABNORMAL LOW (ref >60)
Glucose, Bld: 100 mg/dL — ABNORMAL HIGH (ref 70–99)
Potassium: 4 mmol/L (ref 3.5–5.1)
Sodium: 140 mmol/L (ref 135–145)

## 2024-02-11 LAB — GLUCOSE, CAPILLARY
Glucose-Capillary: 127 mg/dL — ABNORMAL HIGH (ref 70–99)
Glucose-Capillary: 146 mg/dL — ABNORMAL HIGH (ref 70–99)

## 2024-02-11 LAB — MAGNESIUM: Magnesium: 2 mg/dL (ref 1.7–2.4)

## 2024-02-11 MED ORDER — ASPIRIN 81 MG PO TBEC: 81.0000 mg | DELAYED_RELEASE_TABLET | Freq: Every day | ORAL | 0 refills | Status: DC

## 2024-02-11 MED ORDER — ROSUVASTATIN CALCIUM 40 MG PO TABS: 40.0000 mg | ORAL_TABLET | Freq: Every day | ORAL | 0 refills | Status: DC

## 2024-02-11 MED ORDER — ASPIRIN 81 MG PO TBEC
81.0000 mg | DELAYED_RELEASE_TABLET | Freq: Every day | ORAL | 0 refills | Status: DC
  Filled 2024-02-11: qty 30, 30d supply, fill #0

## 2024-02-11 MED ORDER — METOPROLOL SUCCINATE ER 25 MG PO TB24: 25.0000 mg | ORAL_TABLET | Freq: Every day | ORAL | 0 refills | Status: DC

## 2024-02-11 MED ORDER — EMPAGLIFLOZIN 10 MG PO TABS: 10.0000 mg | ORAL_TABLET | Freq: Every day | ORAL | 0 refills | Status: DC

## 2024-02-11 MED ORDER — METOPROLOL SUCCINATE ER 25 MG PO TB24
25.0000 mg | ORAL_TABLET | Freq: Every day | ORAL | 0 refills | Status: DC
  Filled 2024-02-11: qty 30, 30d supply, fill #0

## 2024-02-11 MED ORDER — EMPAGLIFLOZIN 10 MG PO TABS
10.0000 mg | ORAL_TABLET | Freq: Every day | ORAL | 0 refills | Status: DC
  Filled 2024-02-11: qty 30, 30d supply, fill #0

## 2024-02-11 MED ORDER — ROSUVASTATIN CALCIUM 40 MG PO TABS
40.0000 mg | ORAL_TABLET | Freq: Every day | ORAL | 0 refills | Status: DC
  Filled 2024-02-11: qty 30, 30d supply, fill #0

## 2024-02-11 NOTE — TOC Transition Note (Signed)
 Transition of Care [facility]) - Discharge Note   Patient Details  Name: Cody [name] MRN: [id number] Date of Birth: 10/10/1950  [facility] CM/SW Contact:  Omie Bickers, RN Phone Number: 02/11/2024, 12:16 PM   Clinical Narrative:     Sc[facility] D[facility] to [facility] and called [facility] for transport. They state they will be here around 1pm for transport.  Spoke w patient and he states that his home is open, he is agreeable to plan. He declined my offer to call any family to update them.   Final next level of care: Home/Self Care Barriers to Discharge: No Barriers Identified   Patient Goals and CMS Choice Patient states their goals for this hospitalization and ongoing recovery are:: To go home          Discharge Placement                       Discharge Plan and Services Additional resources added to the After Visit Summary for   In-house Referral: Clinical Social Work Discharge Planning Services: CM Consult                                 Social Drivers of Health (SDOH) Interventions SDOH Screenings   Food Insecurity: No Food Insecurity (02/07/2024)  Housing: Low Risk  (02/11/2024)  Transportation Needs: No Transportation Needs (02/11/2024)  Utilities: Not At Risk (02/07/2024)  Alcohol Screen: Low Risk  (02/11/2024)  Depression (PHQ2-9): Medium Risk (11/21/2018)  Social Connections: Unknown (02/07/2024)  Tobacco Use: High Risk (02/06/2024)     Readmission Risk Interventions     No data to display

## 2024-02-11 NOTE — Plan of Care (Signed)
 Patient ID: Cody [name], male   DOB: 05/29/1951, 73 y.o.   MRN: [id number]  Problem: Education: Goal: Ability to describe self-care measures that may prevent or decrease complications (Diabetes Survival Skills Education) will improve Outcome: Adequate for Discharge Goal: Individualized Educational Video(s) Outcome: Adequate for Discharge   Problem: Coping: Goal: Ability to adjust to condition or change in health will improve Outcome: Adequate for Discharge   Problem: Fluid Volume: Goal: Ability to maintain a balanced intake and output will improve Outcome: Adequate for Discharge   Problem: Health Behavior/Discharge Planning: Goal: Ability to identify and utilize available resources and services will improve Outcome: Adequate for Discharge Goal: Ability to manage health-related needs will improve Outcome: Adequate for Discharge   Problem: Metabolic: Goal: Ability to maintain appropriate glucose levels will improve Outcome: Adequate for Discharge   Problem: Nutritional: Goal: Maintenance of adequate nutrition will improve Outcome: Adequate for Discharge Goal: Progress toward achieving an optimal weight will improve Outcome: Adequate for Discharge   Problem: Skin Integrity: Goal: Risk for impaired skin integrity will decrease Outcome: Adequate for Discharge   Problem: Tissue Perfusion: Goal: Adequacy of tissue perfusion will improve Outcome: Adequate for Discharge   Problem: Acute Rehab PT Goals(only PT should resolve) Goal: Pt Will Go Supine/Side To Sit Outcome: Adequate for Discharge Goal: Patient Will Transfer Sit To/From Stand Outcome: Adequate for Discharge Goal: Pt Will Ambulate Outcome: Adequate for Discharge   Problem: Education: Goal: Knowledge of General Education information will improve Description: Including pain rating scale, medication(s)/side effects and non-pharmacologic comfort measures Outcome: Adequate for Discharge   Problem: Health  Behavior/Discharge Planning: Goal: Ability to manage health-related needs will improve Outcome: Adequate for Discharge   Problem: Clinical Measurements: Goal: Ability to maintain clinical measurements within normal limits will improve Outcome: Adequate for Discharge Goal: Will remain free from infection Outcome: Adequate for Discharge Goal: Diagnostic test results will improve Outcome: Adequate for Discharge Goal: Respiratory complications will improve Outcome: Adequate for Discharge Goal: Cardiovascular complication will be avoided Outcome: Adequate for Discharge   Problem: Activity: Goal: Risk for activity intolerance will decrease Outcome: Adequate for Discharge   Problem: Nutrition: Goal: Adequate nutrition will be maintained Outcome: Adequate for Discharge   Problem: Coping: Goal: Level of anxiety will decrease Outcome: Adequate for Discharge   Problem: Elimination: Goal: Will not experience complications related to bowel motility Outcome: Adequate for Discharge Goal: Will not experience complications related to urinary retention Outcome: Adequate for Discharge   Problem: Pain Managment: Goal: General experience of comfort will improve and/or be controlled Outcome: Adequate for Discharge   Problem: Safety: Goal: Ability to remain free from injury will improve Outcome: Adequate for Discharge   Problem: Skin Integrity: Goal: Risk for impaired skin integrity will decrease Outcome: Adequate for Discharge   Problem: Education: Goal: Ability to demonstrate management of disease process will improve Outcome: Adequate for Discharge Goal: Ability to verbalize understanding of medication therapies will improve Outcome: Adequate for Discharge Goal: Individualized Educational Video(s) Outcome: Adequate for Discharge   Problem: Activity: Goal: Capacity to carry out activities will improve Outcome: Adequate for Discharge   Problem: Cardiac: Goal: Ability to achieve  and maintain adequate cardiopulmonary perfusion will improve Outcome: Adequate for Discharge    Genella Kendall, RN

## 2024-02-11 NOTE — Discharge Summary (Signed)
 Physician Discharge Summary  Cody [name] WUJ:[id number] DOB: [birth year] DOA: 02/06/2024  PCP: Sarrah Cure, DO  Admit date: 02/06/2024 Discharge date: 02/11/2024  Admitted From: Home Disposition: Home  Recommendations for Outpatient Follow-up:  Follow up with PCP in 1 week with repeat CBC/BMP Outpatient follow-up with cardiology Follow up in ED if symptoms worsen or new appear   Home Health: No Equipment/Devices: None  Discharge Condition: Stable CODE STATUS: DNR Diet recommendation: Heart healthy/carb modified  Brief/Interim Summary:  73 y.o. male with medical history significant for HTN, HLD, T2DM, diabetic neuropathy, depression and memory impairment presented with worsening shortness of breath.  On presentation, he was on room air, creatinine of 1.34, BNP of 1044, high sensitive troponins of 81 and 95.  EKG was nonischemic.  Chest x-ray showed no acute cardiopulmonary disease.  CTA chest was negative for PE but showed bilateral small pleural effusions.  He was started on IV Lasix .  Cardiology was consulted.  2D echo showed EF of 30 to 35% with severe mitral regurgitation.  Subsequently, diuretics were held because of acute kidney injury by cardiology.  Patient underwent cardiac catheterization on 02/10/2024 which showed nonobstructive CAD.  Cardiology has cleared the patient for discharge home today with outpatient follow-up with cardiology.  Discharge patient home today.  Discharge Diagnoses:   Possible acute systolic heart failure Severe mitral regurgitation Elevated troponin: Possibly from demand ischemia - Strict input and output.  Daily weights.  Fluid restriction.  Negative balance of 1815 cc since admission. - 2D echo showed EF of 30 to 35% and severe mitral regurgitation.  Initially treated with Lasix  along with spironolactone  but were discontinued by cardiology because of rising creatinine.  Toprol-XL has been started as well.   Patient underwent cardiac catheterization on  02/10/2024 which showed nonobstructive CAD.  Cardiology has cleared the patient for discharge home today with outpatient follow-up with cardiology.  Discharge patient home today.  Continue aspirin, statin, Jardiance  and metoprolol succinate on discharge.   AKI on possible CKD stage IIIa -Creatinine 1.34 on presentation and 1.95 today.  No basic recent labs available.  Patient possibly has CKD - Creatinine needs to be followed up soon as an outpatient by PCP and/or cardiology   Diabetes mellitus type 2 - Metformin  has been discontinued.  Carb modified diet.   A1c 6.7.  Outpatient follow-up with PCP.   Hypertension Hyperlipidemia Noncompliance to medications - Patient is noncompliant with medications at home including statin.  Outpatient follow-up with PCP - Cardiology has started the patient on Crestor .  Continue metoprolol succinate on discharge.  Lisinopril /Coreg /spironolactone  have been discontinued   Memory impairment - Currently alert awake oriented x 3.  Has history of mild cognitive impairment.  Outpatient follow-up with PCP and/or neurology  Discharge Instructions  Discharge Instructions     Ambulatory referral to Cardiology   Complete by: As directed    Diet - low sodium heart healthy   Complete by: As directed    Diet Carb Modified   Complete by: As directed    Increase activity slowly   Complete by: As directed       Allergies as of 02/11/2024   No Known Allergies      Medication List     STOP taking these medications    atorvastatin  10 MG tablet Commonly known as: LIPITOR   busPIRone  15 MG tablet Commonly known as: BUSPAR    carvedilol  3.125 MG tablet Commonly known as: COREG    escitalopram  10 MG tablet Commonly known as: LEXAPRO   lisinopril  40 MG tablet Commonly known as: ZESTRIL    metFORMIN  1000 MG tablet Commonly known as: GLUCOPHAGE    omeprazole  40 MG capsule Commonly known as: PRILOSEC   spironolactone  25 MG tablet Commonly known as:  ALDACTONE        TAKE these medications    aspirin EC 81 MG tablet Take 1 tablet (81 mg total) by mouth daily. Swallow whole. Start taking on: February 12, 2024   empagliflozin  10 MG Tabs tablet Commonly known as: JARDIANCE  Take 1 tablet (10 mg total) by mouth daily. Start taking on: February 12, 2024   metoprolol succinate 25 MG 24 hr tablet Commonly known as: TOPROL-XL Take 1 tablet (25 mg total) by mouth daily. Start taking on: February 12, 2024   rosuvastatin  40 MG tablet Commonly known as: CRESTOR  Take 1 tablet (40 mg total) by mouth daily. Start taking on: February 12, 2024        Follow-up Information     Reed, Tiffany L, DO. Schedule an appointment as soon as possible for a visit in 1 week(s).   Specialty: Geriatric Medicine Why: with repeat bmp Contact information: [address] [place] [address] [phone]                No Known Allergies  Consultations: Cardiology   Procedures/Studies: CARDIAC CATHETERIZATION Result Date: 02/10/2024   Mid LAD lesion is 60% stenosed.   RPAV lesion is 60% stenosed. 1.  Mild to moderate disease of the mid LAD, RPLV, and right coronary artery.  The burden of disease does not explain the patient's cardiomyopathy. 2.  Fick cardiac output of 3.7 L/min and Fick cardiac index of 2.2 L/min/m with the following hemodynamics:  Right atrial pressure mean of 9 mmHg  Right ventricular pressure 56/32 with an end-diastolic pressure of 10 mmHg  Wedge pressure mean of 23 mmHg with V waves to 32 mmHg  PA 57/23 with a mean of 35 mmHg  PVR 3.2 Woods units  PA pulsatility index of 3.7 3.  LVEDP of 27 to 31 mmHg over the respiratory cycle Recommendation: Medical therapy.   ECHOCARDIOGRAM COMPLETE Result Date: 02/07/2024    ECHOCARDIOGRAM REPORT   Patient Name:   Cody [name] Date of Exam: 02/07/2024 Medical Rec #:  [id number]   Height:       66.0 in Accession #:    [id number]  Weight:       142.0 lb Date of Birth:  22-Aug-1951   BSA:          1.729 m  Patient Age:    72 years    BP:           140/111 mmHg Patient Gender: M           HR:           99 bpm. Exam Location:  Inpatient Procedure: 2D Echo, 3D Echo, Cardiac Doppler, Color Doppler and Strain Analysis            (Both Spectral and Color Flow Doppler were utilized during            procedure). Indications:     Dyspnea  History:         Patient has no prior history of Echocardiogram examinations.                  Risk Factors:Hypertension and Diabetes.  Sonographer:     Aldon Ambrosia Referring Phys:  [id number] PROSPER M AMPONSAH Diagnosing Phys: Jules Oar MD IMPRESSIONS  1. Left  ventricular ejection fraction, by estimation, is 30 to 35%. The left ventricle has moderately decreased function. The left ventricle demonstrates global hypokinesis with inferior and inferolateral akinesis. Left ventricular diastolic parameters are indeterminate. There is the interventricular septum is flattened in systole, consistent with right ventricular pressure overload.  2. Right ventricular systolic function is moderately reduced. The right ventricular size is normal. Tricuspid regurgitation signal is inadequate for assessing PA pressure.  3. Left atrial size was moderately dilated.  4. The mitral valve is normal in structure. Severe mitral valve regurgitation. No evidence of mitral stenosis.  5. The aortic valve is tricuspid. There is mild calcification of the aortic valve. Aortic valve regurgitation is not visualized. Aortic valve sclerosis/calcification is present, without any evidence of aortic stenosis.  6. The inferior vena cava is normal in size with greater than 50% respiratory variability, suggesting right atrial pressure of 3 mmHg. FINDINGS  Left Ventricle: Left ventricular ejection fraction, by estimation, is 30 to 35%. The left ventricle has moderately decreased function. The left ventricle demonstrates global hypokinesis. The left ventricular internal cavity size was normal in size. There is no left  ventricular hypertrophy. The interventricular septum is flattened in systole, consistent with right ventricular pressure overload. Left ventricular diastolic parameters are indeterminate.  LV Wall Scoring: The inferior wall and posterior wall are akinetic. Right Ventricle: The right ventricular size is normal. No increase in right ventricular wall thickness. Right ventricular systolic function is moderately reduced. Tricuspid regurgitation signal is inadequate for assessing PA pressure. Left Atrium: Left atrial size was moderately dilated. Right Atrium: Right atrial size was normal in size. Pericardium: There is no evidence of pericardial effusion. Mitral Valve: The mitral valve is normal in structure. Severe mitral valve regurgitation, with centrally-directed jet. No evidence of mitral valve stenosis. MV peak gradient, 4.0 mmHg. The mean mitral valve gradient is 1.0 mmHg. Tricuspid Valve: The tricuspid valve is normal in structure. Tricuspid valve regurgitation is mild . No evidence of tricuspid stenosis. Aortic Valve: The aortic valve is tricuspid. There is mild calcification of the aortic valve. Aortic valve regurgitation is not visualized. Aortic valve sclerosis/calcification is present, without any evidence of aortic stenosis. Aortic valve mean gradient measures 2.0 mmHg. Aortic valve peak gradient measures 3.2 mmHg. Aortic valve area, by VTI measures 1.66 cm. Pulmonic Valve: The pulmonic valve was normal in structure. Pulmonic valve regurgitation is trivial. No evidence of pulmonic stenosis. Aorta: The aortic root is normal in size and structure. Venous: The inferior vena cava is normal in size with greater than 50% respiratory variability, suggesting right atrial pressure of 3 mmHg. IAS/Shunts: No atrial level shunt detected by color flow Doppler.  LEFT VENTRICLE PLAX 2D LVIDd:         5.40 cm      Diastology LVIDs:         4.40 cm      LV e' medial:    6.20 cm/s LV PW:         0.60 cm      LV E/e' medial:   14.4 LV IVS:        0.60 cm      LV e' lateral:   7.72 cm/s LVOT diam:     1.90 cm      LV E/e' lateral: 11.6 LV SV:         21 LV SV Index:   12 LVOT Area:     2.84 cm  LV Volumes (MOD) LV vol d, MOD A2C: 130.0 ml LV  vol d, MOD A4C: 125.0 ml LV vol s, MOD A2C: 87.7 ml LV vol s, MOD A4C: 69.2 ml LV SV MOD A2C:     42.3 ml LV SV MOD A4C:     125.0 ml LV SV MOD BP:      52.6 ml RIGHT VENTRICLE            IVC RV Basal diam:  3.10 cm    IVC diam: 1.40 cm RV Mid diam:    3.30 cm RV S prime:     7.83 cm/s TAPSE (M-mode): 1.1 cm LEFT ATRIUM           Index        RIGHT ATRIUM          Index LA diam:      4.10 cm 2.37 cm/m   RA Area:     7.23 cm LA Vol (A2C): 63.1 ml 36.50 ml/m  RA Volume:   12.00 ml 6.94 ml/m LA Vol (A4C): 57.0 ml 32.97 ml/m  AORTIC VALVE                    PULMONIC VALVE AV Area (Vmax):    1.34 cm     PV Vmax:          0.76 m/s AV Area (Vmean):   1.27 cm     PV Peak grad:     2.3 mmHg AV Area (VTI):     1.66 cm     PR End Diast Vel: 2.41 msec AV Vmax:           89.90 cm/s AV Vmean:          63.400 cm/s AV VTI:            0.127 m AV Peak Grad:      3.2 mmHg AV Mean Grad:      2.0 mmHg LVOT Vmax:         42.50 cm/s LVOT Vmean:        28.300 cm/s LVOT VTI:          0.074 m LVOT/AV VTI ratio: 0.58  AORTA Ao Root diam: 2.80 cm Ao Asc diam:  3.10 cm MITRAL VALVE MV Area (PHT): 6.07 cm      SHUNTS MV Area VTI:   1.32 cm      Systemic VTI:  0.07 m MV Peak grad:  4.0 mmHg      Systemic Diam: 1.90 cm MV Mean grad:  1.0 mmHg MV Vmax:       1.00 m/s MV Vmean:      50.8 cm/s MV Decel Time: 125 msec MR Peak grad:   75.9 mmHg MR Mean grad:   48.0 mmHg MR Vmax:        435.50 cm/s MR Vmean:       324.5 cm/s MR PISA:        1.57 cm MR PISA Radius: 0.50 cm MV E velocity: 89.20 cm/s Jules Oar MD Electronically signed by Jules Oar MD Signature Date/Time: 02/07/2024/6:51:14 PM    Final (Updated)    CT Angio Chest PE W and/or Wo Contrast Result Date: 02/06/2024 CLINICAL DATA:  Dyspnea. EXAM: CT  ANGIOGRAPHY CHEST WITH CONTRAST TECHNIQUE: Multidetector CT imaging of the chest was performed using the standard protocol during bolus administration of intravenous contrast. Multiplanar CT image reconstructions and MIPs were obtained to evaluate the vascular anatomy. RADIATION DOSE REDUCTION: This exam was performed according to the departmental dose-optimization program which includes automated  exposure control, adjustment of the mA and/or kV according to patient size and/or use of iterative reconstruction technique. CONTRAST:  65mL OMNIPAQUE  IOHEXOL  350 MG/ML SOLN COMPARISON:  None Available. FINDINGS: Cardiovascular: There is marked severity calcification of the thoracic aorta without evidence of aortic aneurysm. Satisfactory opacification of the pulmonary arteries to the segmental level. No evidence of pulmonary embolism. Normal heart size with mild coronary artery calcification. No pericardial effusion. Mediastinum/Nodes: No enlarged mediastinal, hilar, or axillary lymph nodes. The thyroid gland and trachea demonstrate no significant findings. Mild diffuse esophageal wall thickening is seen. Lungs/Pleura: Mild posterior right apical and anteromedial right upper lobe linear scarring and/or atelectasis is seen. Small bilateral pleural effusions are noted, right slightly greater than left. No pneumothorax is identified. Upper Abdomen: There is a small to moderate sized hiatal hernia. Musculoskeletal: Multilevel degenerative changes seen throughout the thoracic spine. Review of the MIP images confirms the above findings. IMPRESSION: 1. No evidence of pulmonary embolism. 2. Small bilateral pleural effusions, right slightly greater than left. 3. Mild posterior right apical and anteromedial right upper lobe linear scarring and/or atelectasis. 4. Small to moderate sized hiatal hernia. 5. Mild diffuse esophageal wall thickening which may represent esophagitis. 6. Aortic atherosclerosis. Electronically Signed   By:  Virgle Grime M.D.   On: 02/06/2024 21:14   DG Chest 2 View Result Date: 02/06/2024 CLINICAL DATA:  Shortness of breath EXAM: CHEST - 2 VIEW COMPARISON:  None Available. FINDINGS: The heart size and mediastinal contours are within normal limits. Both lungs are clear. The visualized skeletal structures notable for degenerative changes. Minimal blunting of the costophrenic angles which may represent chronic scarring. IMPRESSION: 1. No definite plain film evidence of acute process. Electronically Signed   By: Reagan Camera M.D.   On: 02/06/2024 14:46      Subjective: Patient seen and examined at bedside.  Denies worsening shortness breath, chest pain or vomiting.  Feels okay going home today.  Discharge Exam: Vitals:   02/11/24 0507 02/11/24 0720  BP: 99/73 111/76  Pulse: 93 83  Resp: 18 16  Temp: 97.6 F (36.4 C) 98 F (36.7 C)  SpO2: 100% 98%    General: Pt is alert, awake, not in acute distress.  On room air.  Slow to respond.  Poor historian. Cardiovascular: rate controlled, S1/S2 + Respiratory: bilateral decreased breath sounds at bases Abdominal: Soft, NT, ND, bowel sounds + Extremities: Trace lower extremity edema; no cyanosis    The results of significant diagnostics from this hospitalization (including imaging, microbiology, ancillary and laboratory) are listed below for reference.     Microbiology: Recent Results (from the past 240 hours)  SARS Coronavirus 2 by RT PCR (hospital order, performed in [facility] hospital lab) *cepheid single result test* Anterior Nasal Swab     Status: None   Collection Time: 02/06/24  3:56 PM   Specimen: Anterior Nasal Swab  Result Value Ref Range Status   SARS Coronavirus 2 by RT PCR NEGATIVE NEGATIVE Final    Comment: Performed at [facility], [address], [place], [place] [address]     Labs: BNP (last 3 results) Recent Labs    02/06/24 1545  BNP 1,044.3*   Basic Metabolic Panel: Recent Labs  Lab 02/07/24 0200  02/08/24 0313 02/09/24 0326 02/10/24 0314 02/10/24 1613 02/10/24 1616 02/10/24 1620 02/11/24 0250  NA 140 139 138 137 139 135 139 140  K 4.3 3.9 4.1 3.8 4.2 3.9 4.0 4.0  CL 105 106 103 102  --   --   --  106  CO2 22 24 24 23   --   --   --  23  GLUCOSE 124* 109* 117* 103*  --   --   --  100*  BUN 28* 36* 46* 43*  --   --   --  40*  CREATININE 1.46* 1.73* 1.86* 1.66*  --   --   --  1.95*  CALCIUM  9.7 9.1 9.1 8.6*  --   --   --  8.7*  MG  --  1.8  --  1.7  --   --   --  2.0   Liver Function Tests: No results for input(s): "AST", "ALT", "ALKPHOS", "BILITOT", "PROT", "ALBUMIN" in the last 168 hours. No results for input(s): "LIPASE", "AMYLASE" in the last 168 hours. Recent Labs  Lab 02/08/24 0313  AMMONIA 27   CBC: Recent Labs  Lab 02/06/24 1545 02/07/24 0200 02/08/24 0313 02/10/24 1613 02/10/24 1616 02/10/24 1620  WBC 8.0 9.2 7.8  --   --   --   NEUTROABS  --   --  4.8  --   --   --   HGB 13.7 14.4 13.1 12.2* 12.2* 12.2*  HCT 42.9 42.9 38.5* 36.0* 36.0* 36.0*  MCV 93.7 90.1 88.7  --   --   --   PLT 280 315 265  --   --   --    Cardiac Enzymes: No results for input(s): "CKTOTAL", "CKMB", "CKMBINDEX", "TROPONINI" in the last 168 hours. BNP: Invalid input(s): "POCBNP" CBG: Recent Labs  Lab 02/10/24 0627 02/10/24 1048 02/10/24 1648 02/10/24 2111 02/11/24 0634  GLUCAP 102* 145* 111* 211* 127*   D-Dimer Recent Labs    02/08/24 1529  DDIMER 0.50   Hgb A1c No results for input(s): "HGBA1C" in the last 72 hours. Lipid Profile No results for input(s): "CHOL", "HDL", "LDLCALC", "TRIG", "CHOLHDL", "LDLDIRECT" in the last 72 hours. Thyroid function studies No results for input(s): "TSH", "T4TOTAL", "T3FREE", "THYROIDAB" in the last 72 hours.  Invalid input(s): "FREET3" Anemia work up No results for input(s): "VITAMINB12", "FOLATE", "FERRITIN", "TIBC", "IRON", "RETICCTPCT" in the last 72 hours. Urinalysis    Component Value Date/Time   BILIRUBINUR negative  10/03/2018 1118   KETONESUR negative 10/03/2018 1118   UROBILINOGEN 0.2 10/03/2018 1118   NITRITE Negative 10/03/2018 1118   LEUKOCYTESUR Negative 10/03/2018 1118   Sepsis Labs Recent Labs  Lab 02/06/24 1545 02/07/24 0200 02/08/24 0313  WBC 8.0 9.2 7.8   Microbiology Recent Results (from the past 240 hours)  SARS Coronavirus 2 by RT PCR (hospital order, performed in [facility] hospital lab) *cepheid single result test* Anterior Nasal Swab     Status: None   Collection Time: 02/06/24  3:56 PM   Specimen: Anterior Nasal Swab  Result Value Ref Range Status   SARS Coronavirus 2 by RT PCR NEGATIVE NEGATIVE Final    Comment: Performed at [facility], [address], [place], [place] [address]     Time coordinating discharge: 35 minutes  SIGNED:   Audria Leather, MD  [facility] 02/11/2024, 11:18 AM

## 2024-02-11 NOTE — TOC Initial Note (Signed)
 Transition of Care [facility]) - Initial/Assessment Note    Patient Details  Name: Cody [name] MRN: [id number] Date of Birth: 04/30/1951  [facility] CM/SW Contact:    Arron Big, LCSWA Phone Number: 02/11/2024, 10:14 AM  Clinical Narrative:    Per RN, [name] from [facility] called and requesting CSW call their social worker at [phone]. CSW called and [name] is covering this patient. She stated that [facility] transport can take patient home, will need to call [phone] and speak to receptionist to ask for transport. They are requesting DC summary be sent to [email or web address] for his pcp to receive. Above information has been provided to NCM.   TOC will continue to follow.     Expected Discharge Plan: Home/Self Care Barriers to Discharge: Continued Medical Work up   Patient Goals and CMS Choice Patient states their goals for this hospitalization and ongoing recovery are:: To go home          Expected Discharge Plan and Services In-house Referral: Clinical Social Work Discharge Planning Services: CM Consult   Living arrangements for the past 2 months: Single Family Home                                      Prior Living Arrangements/Services Living arrangements for the past 2 months: Single Family Home Lives with:: Siblings Patient language and need for interpreter reviewed:: Yes Do you feel safe going back to the place where you live?: Yes      Need for Family Participation in Patient Care: No (Comment) Care giver support system in place?: No (comment)   Criminal Activity/Legal Involvement Pertinent to Current Situation/Hospitalization: No - Comment as needed  Activities of Daily Living   ADL Screening (condition at time of admission) Independently performs ADLs?: Yes (appropriate for developmental age) Is the patient deaf or have difficulty hearing?: No Does the patient have difficulty seeing, even when wearing glasses/contacts?: No Does the  patient have difficulty concentrating, remembering, or making decisions?: No  Permission Sought/Granted Permission sought to share information with : Facility Medical sales representative, Family Supports Permission granted to share information with : Yes, Verbal Permission Granted  Share Information with NAME: Matherly,John  Permission granted to share info w AGENCY: [facility]  Permission granted to share info w Relationship: Brother  Permission granted to share info w Contact Information: [phone]  Emotional Assessment Appearance:: Appears stated age Attitude/Demeanor/Rapport: Unable to Assess Affect (typically observed): Unable to Assess Orientation: : Oriented to Self, Oriented to Place, Oriented to  Time, Oriented to Situation Alcohol / Substance Use: Not Applicable Psych Involvement: No (comment)  Admission diagnosis:  Elevated troponin [R79.89] Acute heart failure, unspecified heart failure type (HCC) [I50.9] Dyspnea, unspecified type [R06.00] New onset of congestive heart failure (HCC) [I50.9] Patient Active Problem List   Diagnosis Date Noted   Acute combined systolic and diastolic heart failure (HCC) 02/10/2024   Acute HFrEF (heart failure with reduced ejection fraction) (HCC) 02/08/2024   Cardiomyopathy (HCC) 02/08/2024   Mitral valve insufficiency 02/08/2024   Benign hypertension 02/08/2024   AKI (acute kidney injury) (HCC) 02/07/2024   Elevated troponin 02/07/2024   Dyspnea 02/07/2024   New onset of congestive heart failure (HCC) 02/06/2024   Diabetic neuropathy (HCC) 02/19/2019   Diabetes mellitus (HCC) 10/09/2018   Localized swelling of left foot 10/09/2018   Abnormality of gait due to impairment of balance 10/09/2018   Alteration  in self-care ability 10/09/2018   Essential hypertension 10/09/2018   Memory impairment 10/09/2018   PCP:  Sarrah Cure, DO Pharmacy:   [id number], [place] - [address] [place] [address] Phone:  [phone] Fax: [phone]  [facility] [address] [place] [address] Phone: [phone] Fax: [phone]     Social Drivers of Health (SDOH) Social History: SDOH Screenings   Food Insecurity: No Food Insecurity (02/07/2024)  Housing: Low Risk  (02/07/2024)  Transportation Needs: No Transportation Needs (02/07/2024)  Utilities: Not At Risk (02/07/2024)  Depression (PHQ2-9): Medium Risk (11/21/2018)  Social Connections: Unknown (02/07/2024)  Tobacco Use: High Risk (02/06/2024)   SDOH Interventions:     Readmission Risk Interventions     No data to display

## 2024-02-11 NOTE — Care Management Important Message (Signed)
 Important Message  Patient Details  Name: Cody [name] MRN: [id number] Date of Birth: 07-18-1951   Important Message Given:  Yes - Medicare IM     Janith Melnick 02/11/2024, 12:39 PM

## 2024-02-11 NOTE — Progress Notes (Signed)
 Patient ID: Cody [name], male   DOB: 24-Oct-1950, 73 y.o.   MRN: [id number]  Patient paperwork placed in envelope to give [facility]. An After Visit Summary was printed and given to the patient. Patient education given on medication changes, heart failure education,  and the patient expresses understanding and acceptance of instructions.  Prescriptions faxed to [facility]. Patient to go to discharge lounge.  Katheryne Gorr D Tzirel Leonor 02/11/2024 12:17 PM

## 2024-02-11 NOTE — Progress Notes (Signed)
 Rounding Note    Patient Name: Cody [name] Date of Encounter: 02/11/2024  [facility] Cardiologist: [name], MD   Subjective   No acute events overnight. Resting comfortably, lying flat in bed. Reports that he walked up and down hall yesterday without issues. Reviewed results of cath. Reviewed his Cr bump today, see below.  Inpatient Medications    Scheduled Meds:  aspirin EC  81 mg Oral Daily   dextromethorphan-guaiFENesin  1 tablet Oral BID   empagliflozin   10 mg Oral Daily   enoxaparin  (LOVENOX ) injection  40 mg Subcutaneous Q24H   insulin  aspart  0-15 Units Subcutaneous TID WC   metoprolol succinate  25 mg Oral Daily   rosuvastatin   40 mg Oral Daily   Continuous Infusions:   PRN Meds: acetaminophen  **OR** acetaminophen , menthol-cetylpyridinium, ondansetron  **OR** ondansetron  (ZOFRAN ) IV, senna-docusate   Vital Signs    Vitals:   02/11/24 0124 02/11/24 0507 02/11/24 0720 02/11/24 1126  BP: 100/74 99/73 111/76 121/73  Pulse: 79 93 83 98  Resp: 18 18 16 20   Temp: 97.9 F (36.6 C) 97.6 F (36.4 C) 98 F (36.7 C) (!) 97.4 F (36.3 C)  TempSrc: Oral Oral Oral Oral  SpO2: 99% 100% 98% 100%  Weight:  58.6 kg    Height:        Intake/Output Summary (Last 24 hours) at 02/11/2024 1155 Last data filed at 02/11/2024 0910 Gross per 24 hour  Intake 240 ml  Output 150 ml  Net 90 ml      02/11/2024    5:07 AM 02/10/2024    5:13 AM 02/09/2024    3:29 AM  Last 3 Weights  Weight (lbs) 129 lb 3 oz 129 lb 10.1 oz 129 lb 3 oz  Weight (kg) 58.6 kg 58.8 kg 58.6 kg      Telemetry    SR - Personally Reviewed  Physical Exam   GEN: Well nourished, well developed in no acute distress NECK: No JVD CARDIAC: regular rhythm, normal S1 and S2, no rubs or gallops. 1/6 systolic murmur. VASCULAR: Radial pulses 2+ bilaterally.  RESPIRATORY:  Clear to auscultation without rales, wheezing or rhonchi  ABDOMEN: Soft, non-tender, non-distended MUSCULOSKELETAL:  Moves all  4 limbs independently SKIN: Warm and dry, no edema NEUROLOGIC:  No focal neuro deficits noted. PSYCHIATRIC:  Normal affect    New pertinent results (labs, ECG, imaging, cardiac studies)    Echo images personally reviewed this admission Cath images personally reviewed today  Assessment & Plan    Acute systolic and diastolic heart failure Nonischemic cardiomyopathy -reports being off medications for some time prior to presentation -EF 30-35% with focal wall motion abnormalities inferior/inferolateral -no true admission weight. Initial weight 59.4 kg 5/31, weight today 58.6, net negative 2.3 L -RHC 6/2: CO 3.7, CI 2.2. RAP 9, wedge 23, PA 57/23 (mean 35), PVR 3.2. LVEDP 27-31. -RHC suggests that he might benefit from vasodilators given elevated wedge, but BP (and thus afterload) is already low. RAP slightly elevated but may not be able to diurese enough to lower wedge to normal, and on exam today JVD not appreciated, suggesting RAP may be even lower. Cr rising today, did receive contrast yesterday. -GDMT:  -on Jardiance  10 mg daily  -on metoprolol succinate 25 mg daily now due to hypotension, was ordered carvedilol  prior to admission though was not taking  -was ordered for lisinopril  and spironolactone  prior to admission, though not taking. 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 A1c 6.7  Acute kidney injury on chronic kidney disease stage 3a -diuresis on hold as above. Bumped today after receiving contrast  We discussed options today. He feels well and wants to go home, though he does not have someone who can pick him up. He also has PCP visit in two days, discussed need to check BMET.  [facility] will sign off.   Medication Recommendations:   CONTINUE AT DISCHARGE: aspirin 81 mg daily, Jardiance  10 mg daily, metoprolol succinate 25 mg daily, rosuvastatin  40 mg daily DO NOT RESTART: atorvastatin , carvedilol , lisinopril , spironolactone  Other recommendations (labs, testing, etc):  BMET in 2 days at [facility] visit Follow up as an outpatient:  We will arrange for HF [facility] clinic next week     Signed, Sheryle Donning, MD  02/11/2024, 11:55 AM

## 2024-02-11 NOTE — Progress Notes (Signed)
 Heart Failure Nurse Navigator Progress Note  PCP: Sarrah Cure, DO PCP-Cardiologist: [place] Admission Diagnosis: None Admitted from: [facility] via EMS  Presentation:   Cody [name] presented with intermittent shortness of breath, EKG showing RBBB. BP 103/76, HR 83,BNP 1044,Troponin 95,110,103,  Patient reports to  stopping his medications a few months ago, When asked why? He stated, "because when he dies , he doesn't want a bunch of pills in his stomach, he wants it to be clear."  CTA showed pleural effusions. Chest x-ray showed no acute cardiopulmonary disease. Patient underwent cardiac catheterization on 02/10/2024 which showed nonobstructive CAD.   Pateint was educated on the sign and symptoms of heart failure, daily weights, when to call his doctor or go to the ED, Diet/ fluid restrictions, reports to drinking lots of juice daily, continued education on taking all his medication as prescribed, he stated he gets all his medications delivered to his apartment in pill pack form monthly. Pateitn was reminded to attend all his medical appointments. Per Dr. Alda Amas, a HF [facility] appointment was scheduled for 02/17/2024 @ 9:30 am. Patient stated he can only do appointments on Mondays and Thursdays because that's when he is at [facility] and they drive him to all appointments.   ECHO/ LVEF: 30-35%  Clinical Course:  Past Medical History:  Diagnosis Date   Arthritis    Balance problem    Cataract    Depression    Diabetes mellitus without complication (HCC)    Diabetic neuropathy (HCC) 02/19/2019   Esophageal stricture    Glaucoma    Hiatal hernia    Hyperlipidemia    Hypertension    Numbness      Social History   Socioeconomic History   Marital status: Single    Spouse name: Not on file   Number of children: 0   Years of education: Not on file   Highest education level: 12th grade  Occupational History   Occupation: Retired   Tobacco Use   Smoking status: Never   Smokeless  tobacco: Current    Types: Chew  Vaping Use   Vaping status: Never Used  Substance and Sexual Activity   Alcohol use: Not Currently   Drug use: Never   Sexual activity: Not on file  Other Topics Concern   Not on file  Social History Narrative   Right handed    Caffeine 1-2 cups daily    Lives at home with brother [name]    Social Drivers of Health   Financial Resource Strain: Not on file  Food Insecurity: No Food Insecurity (02/07/2024)   Hunger Vital Sign    Worried About Running Out of Food in the Last Year: Never true    Ran Out of Food in the Last Year: Never true  Transportation Needs: No Transportation Needs (02/07/2024)   PRAPARE - Administrator, Civil Service (Medical): No    Lack of Transportation (Non-Medical): No  Physical Activity: Not on file  Stress: Not on file  Social Connections: Unknown (02/07/2024)   Social Connection and Isolation Panel [NHANES]    Frequency of Communication with Friends and Family: Three times a week    Frequency of Social Gatherings with Friends and Family: Three times a week    Attends Religious Services: Patient declined    Active Member of Clubs or Organizations: Yes    Attends Banker Meetings: More than 4 times per year    Marital Status: Divorced   Water engineer  and Provision:  Detailed education and instructions provided on heart failure disease management including the following:  Signs and symptoms of Heart Failure When to call the physician Importance of daily weights Low sodium diet Fluid restriction Medication management Anticipated future follow-up appointments  Patient education given on each of the above topics.  Patient acknowledges understanding via teach back method and acceptance of all instructions.  Education Materials:  "Living Better With Heart Failure" Booklet, HF zone tool, & Daily Weight Tracker Tool.  Patient has scale at home: Yes Patient has pill box at home: No, gets  his medication in pill packs.     High Risk Criteria for Readmission and/or Poor Patient Outcomes: Heart failure hospital admissions (last 6 months): 1  No Show rate: 10 %  Difficult social situation: No, lives alone Demonstrates medication adherence: No,  Primary Language: English Literacy level: Reading, writing, and comprehension ( some memory impairment)   Barriers of Care:   Medication compliance Diet/ fluid restrictions ( loves all juices) Daily weights Memory impairment  Considerations/Referrals:   Referral made to Heart Failure Pharmacist Stewardship: No Referral made to Heart Failure CSW/NCM TOC: No Referral made to Heart & Vascular TOC clinic: Yes, Per Dr. Alda Amas, HF [facility] 02/17/2024 @  9:30 am.   Items for Follow-up on DC/TOC: Medication compliance Diet/ fluid restrictions/ daily weights Continued HF education   Randie Bustle, BSN, RN Heart Failure Print production planner Chat Only

## 2024-02-11 NOTE — Plan of Care (Signed)
 Patient home and pace with assist

## 2024-02-14 ENCOUNTER — Telehealth (HOSPITAL_COMMUNITY): Payer: Self-pay

## 2024-02-14 NOTE — Telephone Encounter (Signed)
 Called to confirm/remind patient of their appointment at the [facility] on 01/17/2024 9:30.   Appointment:   [] Confirmed  [x] Left mess   [] No answer/No voice mail  [] VM Full/unable to leave message  [] Phone not in service  Patient reminded to bring all medications and/or complete list.  Confirmed patient has transportation. Gave directions, instructed to utilize valet parking.

## 2024-02-17 ENCOUNTER — Encounter (HOSPITAL_COMMUNITY): Payer: Self-pay

## 2024-02-17 ENCOUNTER — Ambulatory Visit (HOSPITAL_COMMUNITY): Payer: Self-pay | Admitting: Adult Health

## 2024-02-17 ENCOUNTER — Ambulatory Visit (HOSPITAL_COMMUNITY)
Admit: 2024-02-17 | Discharge: 2024-02-17 | Disposition: A | Discharge status: Home / Self Care | Payer: Medicare (Managed Care) | Source: Ambulatory Visit | Attending: Adult Health | Admitting: Adult Health

## 2024-02-17 VITALS — BP 104/64 | HR 92 | Wt 135.2 lb

## 2024-02-17 DIAGNOSIS — I340 Nonrheumatic mitral (valve) insufficiency: Secondary | ICD-10-CM | POA: Insufficient documentation

## 2024-02-17 DIAGNOSIS — I130 Hypertensive heart and chronic kidney disease with heart failure and stage 1 through stage 4 chronic kidney disease, or unspecified chronic kidney disease: Secondary | ICD-10-CM | POA: Diagnosis present

## 2024-02-17 DIAGNOSIS — I2510 Atherosclerotic heart disease of native coronary artery without angina pectoris: Secondary | ICD-10-CM | POA: Insufficient documentation

## 2024-02-17 DIAGNOSIS — F17220 Nicotine dependence, chewing tobacco, uncomplicated: Secondary | ICD-10-CM | POA: Diagnosis not present

## 2024-02-17 DIAGNOSIS — I5022 Chronic systolic (congestive) heart failure: Secondary | ICD-10-CM | POA: Diagnosis present

## 2024-02-17 DIAGNOSIS — N1831 Chronic kidney disease, stage 3a: Secondary | ICD-10-CM | POA: Insufficient documentation

## 2024-02-17 DIAGNOSIS — I428 Other cardiomyopathies: Secondary | ICD-10-CM | POA: Insufficient documentation

## 2024-02-17 DIAGNOSIS — E1140 Type 2 diabetes mellitus with diabetic neuropathy, unspecified: Secondary | ICD-10-CM | POA: Insufficient documentation

## 2024-02-17 DIAGNOSIS — I429 Cardiomyopathy, unspecified: Secondary | ICD-10-CM | POA: Diagnosis not present

## 2024-02-17 DIAGNOSIS — E1122 Type 2 diabetes mellitus with diabetic chronic kidney disease: Secondary | ICD-10-CM | POA: Diagnosis not present

## 2024-02-17 DIAGNOSIS — Z7984 Long term (current) use of oral hypoglycemic drugs: Secondary | ICD-10-CM | POA: Diagnosis not present

## 2024-02-17 DIAGNOSIS — I10 Essential (primary) hypertension: Secondary | ICD-10-CM

## 2024-02-17 DIAGNOSIS — I509 Heart failure, unspecified: Secondary | ICD-10-CM

## 2024-02-17 DIAGNOSIS — R413 Other amnesia: Secondary | ICD-10-CM | POA: Insufficient documentation

## 2024-02-17 LAB — BASIC METABOLIC PANEL WITH GFR
Anion gap: 9 (ref 5–15)
BUN: 36 mg/dL — ABNORMAL HIGH (ref 8–23)
CO2: 21 mmol/L — ABNORMAL LOW (ref 22–32)
Calcium: 9.3 mg/dL (ref 8.9–10.3)
Chloride: 108 mmol/L (ref 98–111)
Creatinine, Ser: 1.5 mg/dL — ABNORMAL HIGH (ref 0.61–1.24)
GFR, Estimated: 49 mL/min — ABNORMAL LOW (ref >60)
Glucose, Bld: 182 mg/dL — ABNORMAL HIGH (ref 70–99)
Potassium: 4.7 mmol/L (ref 3.5–5.1)
Sodium: 138 mmol/L (ref 135–145)

## 2024-02-17 LAB — BRAIN NATRIURETIC PEPTIDE: B Natriuretic Peptide: 802.1 pg/mL — ABNORMAL HIGH (ref 0.0–100.0)

## 2024-02-17 MED ORDER — FUROSEMIDE 40 MG PO TABS: 40.0000 mg | ORAL_TABLET | Freq: Every day | ORAL | 3 refills | Status: DC

## 2024-02-17 NOTE — Progress Notes (Signed)
 HEART & VASCULAR TRANSITION OF CARE CONSULT NOTE      Referring Physician:Dr Genita [name], Cody L, DO  [facility].   Chief Complaint: Heart Failure   HPI: Referred to clinic by Cody [name] for heart failure consultation.   Cody [name] is a 73 y.o. male with a history of chronic HFrEF,  MVR, HTN, CKD IIIa, neuropathy. DMII, and memory impairment. [facility].   Presented to the ED with increased shortness of breath. Admitted with 02/07/24 with Acute HFrEF. Echo EF Cath with nonobstructive CAD and preserved cardiac output. GDMT limited by AKI/hypotenision. Placed on coreg  twice a day + jardiance . Discharged 02/11/2024   He is here today with his brother. Complaining of chronic balance issues. SOB with exertion. Denies PND/Orthopnea. Denies chest pain. Uses a walker all the time. Poor appetite.  No fever or chills. Eats microwave foods. Spends most of his time in his bed. Taking all medications. Lives alone.  His brother is able to do all grocery shopping\ and drive him to appointments.   Cardiac Testing  Cath 02/10/24 Mid LAD lesion is 60% stenosed.   RPAV lesion is 60% stenosed.  1.  Mild to moderate disease of the mid LAD, RPLV, and right coronary artery.  The burden of disease does not explain the patient's cardiomyopathy. 2.  Fick cardiac output of 3.7 [name]/min and Fick cardiac index of 2.2 [name]/min/m with the following hemodynamics:            Right atrial pressure mean of 9 mmHg            Right ventricular pressure 56/32 with an end-diastolic pressure of 10 mmHg            Wedge pressure mean of 23 mmHg with V waves to 32 mmHg            PA 57/23 with a mean of 35 mmHg            PVR 3.2 Woods units            PA pulsatility index of 3.7 3.  LVEDP of 27 to 31 mmHg over the respiratory cycle  Echo 02/07/2024   EF  30-35% RV moderately reduced. Severe MR  Past Medical History:  Diagnosis Date   Arthritis    Balance problem    Cataract    Depression     Diabetes mellitus without complication (HCC)    Diabetic neuropathy (HCC) 02/19/2019   Esophageal stricture    Glaucoma    Hiatal hernia    Hyperlipidemia    Hypertension    Numbness     Current Outpatient Medications  Medication Sig Dispense Refill   aspirin  81 MG chewable tablet Chew 81 mg by mouth daily.     carvedilol  (COREG ) 3.125 MG tablet Take 3.125 mg by mouth 2 (two) times daily with a meal.     empagliflozin  (JARDIANCE ) 10 MG TABS tablet Take 10 mg by mouth daily.     furosemide  (LASIX ) 40 MG tablet Take 1 tablet (40 mg total) by mouth daily. 90 tablet 3   Rosuvastatin  Calcium  40 MG CPSP Take 40 mg by mouth daily.     No current facility-administered medications for this encounter.    No Known Allergies    Social History   Socioeconomic History   Marital status: Single    Spouse name: Not on file   Number of children: 0   Years of education: Not on file  Highest education level: 12th grade  Occupational History   Occupation: Retired   Tobacco Use   Smoking status: Never   Smokeless tobacco: Current    Types: Chew  Vaping Use   Vaping status: Never Used  Substance and Sexual Activity   Alcohol use: Not Currently   Drug use: Never   Sexual activity: Not Currently  Other Topics Concern   Not on file  Social History Narrative   Right handed    Caffeine 1-2 cups daily    Lives at home with brother Cody [name]    Social Drivers of Health   Financial Resource Strain: Not on file  Food Insecurity: No Food Insecurity (02/07/2024)   Hunger Vital Sign    Worried About Running Out of Food in the Last Year: Never true    Ran Out of Food in the Last Year: Never true  Transportation Needs: No Transportation Needs (02/11/2024)   PRAPARE - Administrator, Civil Service (Medical): No    Lack of Transportation (Non-Medical): No  Physical Activity: Not on file  Stress: Not on file  Social Connections: Unknown (02/07/2024)   Social Connection and Isolation Panel  [NHANES]    Frequency of Communication with Friends and Family: Three times a week    Frequency of Social Gatherings with Friends and Family: Three times a week    Attends Religious Services: Patient declined    Active Member of Clubs or Organizations: Yes    Attends Banker Meetings: More than 4 times per year    Marital Status: Divorced  Intimate Partner Violence: Not At Risk (02/07/2024)   Humiliation, Afraid, Rape, and Kick questionnaire    Fear of Current or Ex-Partner: No    Emotionally Abused: No    Physically Abused: No    Sexually Abused: No      Family History  Problem Relation Age of Onset   Obesity Mother    Heart failure Father    Diabetes Brother    Hypertension Brother    Heart failure Brother    Colon cancer Neg Hx    Esophageal cancer Neg Hx    Pancreatic cancer Neg Hx    Stomach cancer Neg Hx    Rectal cancer Neg Hx    Colon polyps Neg Hx     Vitals:   02/17/24 0909  BP: 104/64  Pulse: 92  SpO2: 98%  Weight: 61.3 kg (135 lb 3.2 oz)   Wt Readings from Last 3 Encounters:  02/17/24 61.3 kg (135 lb 3.2 oz)  02/11/24 58.6 kg (129 lb 3 oz)  09/19/23 62.6 kg (138 lb)    PHYSICAL EXAM: General:  Arrived in a wheelchair.  No resp difficulty Neck: supple. JVP 9-10  Cor: PMI nondisplaced. Regular rate & rhythm. No rubs, gallops or murmurs. Lungs: clear Abdomen: soft, nontender, nondistended.  Extremities: no cyanosis, clubbing, rash, edema Neuro: alert & oriented x3   ASSESSMENT & PLAN: 1. Chronic HFrEF, NICM  Cath nonobstructive CAD. Echo EF 30-35% RV moderately reduced. Possible familial brother  and father with heart failure.  NYHA III. Volume status mildly elevated. Suspect this is due to high sodium diet. Start lasix  40 mg daily. Provided script for diuretics.  GDMT  BB- Continue coreg  3.125 mg twice a DAY  Ace/ARB/ARNI- Hold off, BP soft MRA- Hold off BP soft.  SGLT2i- Continue jardiance  10 mg daily  Check BMET - Repeat Echo 3-4  months     2. Mitral Regurgitation  Severe on  Echo. Will need to reassess after HF meds optimized. May need TEE but will wait until repeat   3. CKD Stage IIIa Check BMET   Referred to HFSW (PCP, Medications, Transportation, ETOH Abuse, Drug Abuse, Insurance, Financial ): Followed by [employer]. I discussed home health but he does not want anyone is his home.  Refer to Pharmacy:  No Refer to Home Health:  No Refer to Advanced Heart Failure Clinic: Patient request, he would like to see his brothers cardiologist.  Refer to General Cardiology: Yes--> Cody Veryl Gottron  Follow up as needed  Jaquia Benedicto NP-C  2:41 PM

## 2024-02-17 NOTE — Patient Instructions (Addendum)
 START Lasix  40 mg daily.  Labs done today, your results will be available in MyChart, we will contact you for abnormal readings.  Thank you for allowing us  to provider your heart failure care after your recent hospitalization. Please follow-up with Dr. Veryl Gottron, once her office calls to arrange your appointment.  If you have any questions, issues, or concerns before your next appointment please call our office at [phone], opt. [phone] and leave a message for the triage nurse.

## 2024-03-05 ENCOUNTER — Other Ambulatory Visit (HOSPITAL_COMMUNITY): Payer: Self-pay | Admitting: Internal Medicine

## 2024-03-05 ENCOUNTER — Encounter (HOSPITAL_BASED_OUTPATIENT_CLINIC_OR_DEPARTMENT_OTHER): Payer: Self-pay | Admitting: Nurse Practitioner

## 2024-03-05 ENCOUNTER — Ambulatory Visit (INDEPENDENT_AMBULATORY_CARE_PROVIDER_SITE_OTHER): Payer: Medicare (Managed Care) | Admitting: Nurse Practitioner

## 2024-03-05 VITALS — BP 120/74 | HR 85 | Ht 65.0 in | Wt 133.2 lb

## 2024-03-05 DIAGNOSIS — I428 Other cardiomyopathies: Secondary | ICD-10-CM | POA: Diagnosis not present

## 2024-03-05 DIAGNOSIS — I5023 Acute on chronic systolic (congestive) heart failure: Secondary | ICD-10-CM

## 2024-03-05 DIAGNOSIS — I2510 Atherosclerotic heart disease of native coronary artery without angina pectoris: Secondary | ICD-10-CM

## 2024-03-05 DIAGNOSIS — I10 Essential (primary) hypertension: Secondary | ICD-10-CM | POA: Diagnosis not present

## 2024-03-05 DIAGNOSIS — R5383 Other fatigue: Secondary | ICD-10-CM

## 2024-03-05 DIAGNOSIS — Z79899 Other long term (current) drug therapy: Secondary | ICD-10-CM

## 2024-03-05 DIAGNOSIS — R413 Other amnesia: Secondary | ICD-10-CM

## 2024-03-05 MED ORDER — POTASSIUM CHLORIDE ER 10 MEQ PO TBCR
10.0000 meq | EXTENDED_RELEASE_TABLET | Freq: Every day | ORAL | 3 refills | Status: AC
Start: 2024-03-05 — End: ?

## 2024-03-05 MED ORDER — FUROSEMIDE 40 MG PO TABS: 40.0000 mg | ORAL_TABLET | Freq: Every day | ORAL | 3 refills | Status: DC

## 2024-03-05 MED ORDER — FUROSEMIDE 40 MG PO TABS: 40.0000 mg | ORAL_TABLET | Freq: Every day | ORAL | 3 refills | Status: AC

## 2024-03-05 NOTE — Patient Instructions (Signed)
 Medication Instructions:    START Lasix  one (1) tablet by mouth ( 40 mg) daily.   START Kdur one (1) tablet by mouth ( 10 mEq) daily.    *If you need a refill on your cardiac medications before your next appointment, please call your pharmacy*  Lab Work:  TODAY!!!!! PRO BNP/BMET  Your physician recommends that you return for lab work in two weeks, no fasting.    If you have labs (blood work) drawn today and your tests are completely normal, you will receive your results only by: MyChart Message (if you have MyChart) OR A paper copy in the mail If you have any lab test that is abnormal or we need to change your treatment, we will call you to review the results.  Testing/Procedures:  none ordered.   Follow-Up: At [facility], you and your health needs are our priority.  As part of our continuing mission to provide you with exceptional heart care, our providers are all part of one team.  This team includes your primary Cardiologist (physician) and Advanced Practice Providers or APPs (Physician Assistants and Nurse Practitioners) who all work together to provide you with the care you need, when you need it.  Your next appointment:   3 month(s)  Provider:   Shelda Bruckner, MD    We recommend signing up for the patient portal called MyChart.  Sign up information is provided on this After Visit Summary.  MyChart is used to connect with patients for Virtual Visits (Telemedicine).  Patients are able to view lab/test results, encounter notes, upcoming appointments, etc.  Non-urgent messages can be sent to your provider as well.   To learn more about what you can do with MyChart, go to [email or web address].

## 2024-03-05 NOTE — Progress Notes (Signed)
 Cardiology Office Note   Date:  03/05/2024  ID:  Cody [name], DOB 1950-12-21, MRN [id number] PCP: Cloria Annabella CROME, DO  [facility] Providers Cardiologist:  Redell Shallow, MD     History of Present Illness Admission 5/29-02/11/24 for patient with past medical history diabetes, hypertension, hyperlipidemia, esophageal stricture, memory impairment who presented with acute systolic congestive heart failure.  He previously resided in [place].  Apparently had some heart issues in the past but does not recall what issues he had and there were no records for review.  Over the previous 5 days he had progressive DOE, no orthopnea, PND, pedal edema, chest pain, or syncope.  He reported stopping medications 2 months prior.  EKG showed sinus rhythm with right bundle branch block.  BNP was 1044, troponin 95 >>110 >> 103.  He was not markedly volume overloaded on exam but did have pleural effusions on CT.  Echocardiogram showed severe LV dysfunction with EF 30 to 35% with inferior lateral akinesis and moderate to severe MR.  He underwent right and left heart catheterization on 02/10/2024 which revealed mid LAD lesion 60% stenosed, RPA V lesion 60% stenosed.  Fick cardiac output 3.7L/MI and an cardiac index of 2.2 L/min, RAP 9, RV pressure 56/32 with end-diastolic pressure of 10 mmHg, wedge pressure mean 23 with V waves to 32, PA 57/23 with mean of 35 mmHg, PVR 3.2 Woods units, LVEDP 27-31 mmHg over respiratory cycle.  Given elevated wedge pressure consideration given to vasodilators however BP already low.  RAP slightly elevated but on exam JVD not appreciated and creatinine was rising.  Lisinopril  and spironolactone  were on hold due to AKI.  MR was felt to be moderate secondary to LV dilation.  Advised to continue aspirin  81 mg daily, Jardiance  10 mg daily, metoprolol  succinate 25 mg daily, and rosuvastatin  40 mg daily.  Seen in heart failure clinic on 02/17/2024 and was continued on Jardiance  10 mg daily,  carvedilol  3.125 mg twice daily and was advised to start Lasix  40 mg daily. Renal function stable on labs, BNP 802 trending down from 1044 two weeks prior.   History of Present Illness Cody [name] is a 73 year old male who is here today for follow-up of heart failure and is accompanied by his brother. He experiences persistent shortness of breath, which has not improved since his recent hospitalization. Breathing is easier when lying on his left side but difficult on his right side due to a sensation of a 'clot'. He denies chest pain but reports worsening fatigue. He sleeps comfortably in a bed and does not experience abdominal discomfort. He tracks his weight and fluid intake, consuming about a gallon of milk every two days, and estimates his fluid intake aligns with recommendations. He has recorded home weights which range from 130-134 lb and have not varied > 3 lbs in a day. There is confusion regarding his medication regimen, with discrepancies between hospital records and his current intake. He asks if he can get a vitamin shot which has helped him feel better in the past. He has assistance through the [facility] for a few hours a week.  He lives alone with family that visit intermittently. He uses a walker for assistance.   Discussed the use of AI scribe software for clinical note transcription with the patient, who gave verbal consent to proceed.  ROS: + fatigue, + shortness of breath  Studies Reviewed      No results found for: LIPOA  Risk Assessment/Calculations  Physical Exam VS:  BP 120/74 (BP Location: Right Arm, Patient Position: Sitting, Cuff Size: Normal)   Pulse 85   Ht 5' 5 (1.651 m)   Wt 133 lb 3.2 oz (60.4 kg)   SpO2 98%   BMI 22.17 kg/m    Wt Readings from Last 3 Encounters:  03/05/24 133 lb 3.2 oz (60.4 kg)  02/17/24 135 lb 3.2 oz (61.3 kg)  02/11/24 129 lb 3 oz (58.6 kg)    GEN: Frail chronically ill appearing, appearing older than stated age  in no acute distress NECK: No JVD; No carotid bruits CARDIAC: RRR, no murmurs, rubs, gallops RESPIRATORY:  Clear to auscultation without rales, wheezing or rhonchi  ABDOMEN: Soft, distended, non-tender EXTREMITIES:  No edema; No deformity   Assessment & Plan Acute on Chronic HFrEF/Dyspnea Admission 5/29-02/11/24 with acute heart failure. EF 30-35%, LV global hypokinesis with inferior and inferolateral akinesis, indeterminate diastolic parameters, moderately reduced RV.  He reports persistent dyspnea and fatigue, with positional relief laying on left side only. He admits he is not able to be very active at home where he lives alone with intermittent assistance from [facility] and family. His medications are packaged by [facility] and he is not aware of what he is taking. Home weight has been stable.  He reports compliance to fluid restriction of 2 L daily.  Carvedilol  was prescribed at some point, however he has continued on metoprolol .  He is not currently taking furosemide  which was prescribed by  AHF clinic on 6/9. Abdominal distention is noted but no extremity edema or JVD. Will have him start furosemide  40 mg and potassium chloride 10 mEq daily. Will continue metoprolol  for now. Limit fluid intake to two quarts daily and continue to monitor weight and track fluid intake daily. Specifics regarding diet were not discussed. We will get BNP and BMET today and repeat BMET in 2 weeks. Follow-up in 3 months.  Continue Jardiance , metoprolol .  Fatigue   Chronic fatigue is likely due to heart failure and decreased cardiac output. He requests a vitamin shot to give him more energy. Advised him to follow-up with PCP for concerns with vitamin deficiency.  Encouraged continue medication adherence, weight and fluid monitoring and to report concerns prior to next appointment.   CAD Nonobstructive CAD noted on cath 02/10/2024.  He denies chest pain. Has dyspnea as noted above felt to be secondary to HFrEF.   Continue aspirin , Toprol , rosuvastatin .  Hyperlipidemia LDL goal < 70 He was started on atorvastatin  during hospitalization.  Consider follow-up lipid testing at next office visit.  Memory Impairment He has difficulty answering specific questions and does not provide good historical information. Family is present to assist.   Medication management   Discrepancies exist between the medication list and hospital records. We contacted [facility] to ensure accuracy and changes were noted.         Dispo: 3 months with Dr. Lonni  Signed, Rosaline Bane, NP-C

## 2024-03-06 ENCOUNTER — Emergency Department (HOSPITAL_COMMUNITY): Payer: Medicare (Managed Care)

## 2024-03-06 ENCOUNTER — Emergency Department (HOSPITAL_COMMUNITY)
Admission: EM | Admit: 2024-03-06 | Discharge: 2024-03-06 | Disposition: A | Discharge status: Home / Self Care | Payer: Medicare (Managed Care) | Attending: Emergency Medicine | Admitting: Emergency Medicine

## 2024-03-06 ENCOUNTER — Other Ambulatory Visit: Payer: Self-pay

## 2024-03-06 ENCOUNTER — Encounter (HOSPITAL_COMMUNITY): Payer: Self-pay

## 2024-03-06 ENCOUNTER — Ambulatory Visit: Payer: Self-pay | Admitting: Nurse Practitioner

## 2024-03-06 DIAGNOSIS — I110 Hypertensive heart disease with heart failure: Secondary | ICD-10-CM | POA: Insufficient documentation

## 2024-03-06 DIAGNOSIS — Z7982 Long term (current) use of aspirin: Secondary | ICD-10-CM | POA: Diagnosis not present

## 2024-03-06 DIAGNOSIS — R7989 Other specified abnormal findings of blood chemistry: Secondary | ICD-10-CM | POA: Diagnosis present

## 2024-03-06 DIAGNOSIS — Z7984 Long term (current) use of oral hypoglycemic drugs: Secondary | ICD-10-CM | POA: Diagnosis not present

## 2024-03-06 DIAGNOSIS — Z79899 Other long term (current) drug therapy: Secondary | ICD-10-CM | POA: Insufficient documentation

## 2024-03-06 DIAGNOSIS — Z8679 Personal history of other diseases of the circulatory system: Secondary | ICD-10-CM

## 2024-03-06 DIAGNOSIS — I5020 Unspecified systolic (congestive) heart failure: Secondary | ICD-10-CM | POA: Insufficient documentation

## 2024-03-06 DIAGNOSIS — E119 Type 2 diabetes mellitus without complications: Secondary | ICD-10-CM | POA: Diagnosis not present

## 2024-03-06 LAB — BASIC METABOLIC PANEL WITH GFR
Anion gap: 11 (ref 5–15)
BUN/Creatinine Ratio: 26 — ABNORMAL HIGH (ref 10–24)
BUN: 48 mg/dL — ABNORMAL HIGH (ref 8–27)
BUN: 49 mg/dL — ABNORMAL HIGH (ref 8–23)
CO2: 17 mmol/L — ABNORMAL LOW (ref 20–29)
CO2: 19 mmol/L — ABNORMAL LOW (ref 22–32)
Calcium: 9.5 mg/dL (ref 8.6–10.2)
Calcium: 9.6 mg/dL (ref 8.9–10.3)
Chloride: 107 mmol/L — ABNORMAL HIGH (ref 96–106)
Chloride: 109 mmol/L (ref 98–111)
Creatinine, Ser: 1.82 mg/dL — ABNORMAL HIGH (ref 0.76–1.27)
Creatinine, Ser: 1.65 mg/dL — ABNORMAL HIGH (ref 0.61–1.24)
GFR, Estimated: 44 mL/min — ABNORMAL LOW (ref >60)
Glucose, Bld: 166 mg/dL — ABNORMAL HIGH (ref 70–99)
Glucose: 158 mg/dL — ABNORMAL HIGH (ref 70–99)
Potassium: 4.6 mmol/L (ref 3.5–5.2)
Potassium: 4.5 mmol/L (ref 3.5–5.1)
Sodium: 143 mmol/L (ref 134–144)
Sodium: 139 mmol/L (ref 135–145)
eGFR: 39 mL/min/{1.73_m2} — ABNORMAL LOW (ref >59)

## 2024-03-06 LAB — CBC WITH DIFFERENTIAL/PLATELET
Abs Immature Granulocytes: 0.04 10*3/uL (ref 0.00–0.07)
Basophils Absolute: 0.1 10*3/uL (ref 0.0–0.1)
Basophils Relative: 1 %
Eosinophils Absolute: 0.1 10*3/uL (ref 0.0–0.5)
Eosinophils Relative: 2 %
HCT: 40.7 % (ref 39.0–52.0)
Hemoglobin: 13.2 g/dL (ref 13.0–17.0)
Immature Granulocytes: 1 %
Lymphocytes Relative: 17 %
Lymphs Abs: 1.3 10*3/uL (ref 0.7–4.0)
MCH: 29.6 pg (ref 26.0–34.0)
MCHC: 32.4 g/dL (ref 30.0–36.0)
MCV: 91.3 fL (ref 80.0–100.0)
Monocytes Absolute: 0.7 10*3/uL (ref 0.1–1.0)
Monocytes Relative: 10 %
Neutro Abs: 5.3 10*3/uL (ref 1.7–7.7)
Neutrophils Relative %: 69 %
Platelets: 267 10*3/uL (ref 150–400)
RBC: 4.46 MIL/uL (ref 4.22–5.81)
RDW: 14.3 % (ref 11.5–15.5)
WBC: 7.5 10*3/uL (ref 4.0–10.5)
nRBC: 0 % (ref 0.0–0.2)

## 2024-03-06 LAB — BRAIN NATRIURETIC PEPTIDE: B Natriuretic Peptide: 1093.4 pg/mL — ABNORMAL HIGH (ref 0.0–100.0)

## 2024-03-06 LAB — PRO B NATRIURETIC PEPTIDE: NT-Pro BNP: 17286 pg/mL — ABNORMAL HIGH (ref 0–376)

## 2024-03-06 NOTE — ED Notes (Signed)
 Called CCMD to add to cardiac monitoring

## 2024-03-06 NOTE — Telephone Encounter (Signed)
 Pt's brother requested we call the number of [phone]

## 2024-03-06 NOTE — ED Provider Notes (Signed)
 [place] EMERGENCY DEPARTMENT AT [facility] Provider Note   CSN: [id number] Arrival date & time: 03/06/24  1101     Patient presents with: fluid overload   Cody [name] is a 73 y.o. male.  With a history of HFrEF diabetes and hypertension who presents to ED given concern for laboratory abnormality.  Patient was seen by his cardiologist 2 days ago outpatient labs revealed BNP significantly elevated of 17,000.  He was instructed to come to the ED for further evaluation.  The patient himself has no systemic complaints at this time.  No chest pain shortness of breath peripheral edema or recent weight gain.  No fevers chills or coughing.   HPI     Prior to Admission medications   Medication Sig Start Date End Date Taking? Authorizing Provider  aspirin  81 MG chewable tablet Chew 81 mg by mouth daily.    [provider]  brimonidine (ALPHAGAN) 0.2 % ophthalmic solution 1 drop 3 (three) times daily.    [provider]  empagliflozin  (JARDIANCE ) 10 MG TABS tablet Take 10 mg by mouth daily.    [provider]  furosemide  (LASIX ) 40 MG tablet Take 1 tablet (40 mg total) by mouth daily. 03/05/24   Swinyer, Rosaline HERO, NP  metoprolol  succinate (TOPROL -XL) 25 MG 24 hr tablet Take 25 mg by mouth daily.    [provider]  potassium chloride  (KLOR-CON ) 10 MEQ tablet Take 1 tablet (10 mEq total) by mouth daily. 03/05/24   Swinyer, Rosaline HERO, NP  Rosuvastatin  Calcium  40 MG CPSP Take 40 mg by mouth daily.    [provider]    Allergies: Patient has no known allergies.    Review of Systems  Updated Vital Signs BP 120/76   Pulse 99   Temp 98.1 F (36.7 C)   Resp (!) 22   Ht 5' 6 (1.676 m)   Wt 59 kg   SpO2 99%   BMI 20.98 kg/m   Physical Exam Vitals and nursing note reviewed.  HENT:     Head: Normocephalic and atraumatic.   Eyes:     Pupils: Pupils are equal, round, and reactive to light.    Cardiovascular:     Rate and  Rhythm: Normal rate and regular rhythm.  Pulmonary:     Effort: Pulmonary effort is normal.     Breath sounds: Normal breath sounds.  Abdominal:     Palpations: Abdomen is soft.     Tenderness: There is no abdominal tenderness.   Musculoskeletal:     Right lower leg: No edema.     Left lower leg: No edema.   Skin:    General: Skin is warm and dry.   Neurological:     Mental Status: He is alert.   Psychiatric:        Mood and Affect: Mood normal.     (all labs ordered are listed, but only abnormal results are displayed) Labs Reviewed  BASIC METABOLIC PANEL WITH GFR - Abnormal; Notable for the following components:      Result Value   CO2 19 (*)    Glucose, Bld 166 (*)    BUN 49 (*)    Creatinine, Ser 1.65 (*)    GFR, Estimated 44 (*)    All other components within normal limits  BRAIN NATRIURETIC PEPTIDE - Abnormal; Notable for the following components:   B Natriuretic Peptide 1,093.4 (*)    All other components within normal limits  CBC WITH DIFFERENTIAL/PLATELET  EKG: EKG Interpretation Date/Time:  Friday March 06 2024 11:43:49 EDT Ventricular Rate:  86 PR Interval:  168 QRS Duration:  140 QT Interval:  408 QTC Calculation: 488 R Axis:   201  Text Interpretation: Normal sinus rhythm Right bundle branch block Abnormal ECG When compared with ECG of 06-Feb-2024 12:46, PREVIOUS ECG IS PRESENT Confirmed by Ula Barter [phone]) on 03/06/2024 11:57:45 AM  Radiology: [facility] Chest 2 View Result Date: 03/06/2024 CLINICAL DATA:  Shortness of breath. EXAM: CHEST - 2 VIEW COMPARISON:  02/06/2024 FINDINGS: Stable borderline enlarged cardiac silhouette. Clear lungs with normal vascularity. Thoracic spine degenerative changes. IMPRESSION: No acute abnormality. Electronically Signed   By: Elspeth Bathe M.D.   On: 03/06/2024 13:18     Procedures   Medications Ordered in the ED - No data to display                                  Medical Decision Making 73 year old male with  history as above instructed to come in by cardiology office after finding of significantly elevated BNP of 17,000.  Clinically he does not appear volume overloaded.  No shortness of breath.  Chest x-ray looks clear.  Our repeat BNP here shows value of about thousand.  This is around his baseline.  Suppose BNP of 17,000 from a couple days ago was laboratory error.  Patient feels well and is requesting discharge.  Very low suspicion for acute heart failure exacerbation or other acute process at this time        Final diagnoses:  History of heart failure    ED Discharge Orders     None          Pamella Ozell LABOR, DO 03/06/24 1421

## 2024-03-06 NOTE — ED Triage Notes (Addendum)
 Pt here from cardiologist office. Doctor states BNP was 17,000. Denies SHOB and CP. Denies recent weight gain.

## 2024-03-06 NOTE — ED Provider Triage Note (Signed)
 Emergency Medicine Provider Triage Evaluation Note  Joeph Szatkowski , a 73 y.o. male  was evaluated in triage.  Pt complains of shortness of breath.  Was sent in by cardiology for elevated BNP apparently.  Review of Systems  Positive: Shortness of breath Negative: Chest pain  Physical Exam  BP 110/72 (BP Location: Right Arm)   Pulse 92   Temp 97.9 F (36.6 C)   Resp (!) 21   Ht 5' 6 (1.676 m)   Wt 59 kg   SpO2 100%   BMI 20.98 kg/m  Gen:   Awake, no distress   Resp:  Normal effort  MSK:   Moves extremities without difficulty  Other:  Lungs clear with but diminished at lung bases  Medical Decision Making  Medically screening exam initiated at 11:34 AM.  Appropriate orders placed.  Tajay Muzzy was informed that the remainder of the evaluation will be completed by another provider, this initial triage assessment does not replace that evaluation, and the importance of remaining in the ED until their evaluation is complete.  Labs including BMP ordered for further evaluation.  Remainder of evaluation be conducted after a room is available.   Ula Prentice SAUNDERS, MD 03/06/24 [phone]

## 2024-03-06 NOTE — Telephone Encounter (Signed)
 Pt brother called in asking to speak with nurse again.

## 2024-03-06 NOTE — Discharge Instructions (Signed)
 You were seen in the emerged part for an abnormal laboratory value. We repeated the same test called a BNP and the value was around your normal level The rest of your blood work looked okay You are feeling well Is importantly follow-up with your primary care doctor and cardiologist Return to the emergency department for chest pain trouble breathing or any other concerns

## 2024-03-06 NOTE — Telephone Encounter (Signed)
 Patient's brother is calling to follow up. Please advise.

## 2024-03-20 LAB — BASIC METABOLIC PANEL WITH GFR
BUN/Creatinine Ratio: 23 (ref 10–24)
BUN: 34 mg/dL — ABNORMAL HIGH (ref 8–27)
CO2: 18 mmol/L — ABNORMAL LOW (ref 20–29)
Calcium: 9.5 mg/dL (ref 8.6–10.2)
Chloride: 103 mmol/L (ref 96–106)
Creatinine, Ser: 1.45 mg/dL — ABNORMAL HIGH (ref 0.76–1.27)
Glucose: 236 mg/dL — ABNORMAL HIGH (ref 70–99)
Potassium: 4.8 mmol/L (ref 3.5–5.2)
Sodium: 139 mmol/L (ref 134–144)
eGFR: 51 mL/min/1.73 — ABNORMAL LOW (ref >59)

## 2024-06-05 LAB — LAB REPORT - SCANNED
A1c: 15.4
EGFR: 50

## 2024-06-08 ENCOUNTER — Ambulatory Visit (HOSPITAL_BASED_OUTPATIENT_CLINIC_OR_DEPARTMENT_OTHER): Payer: Self-pay | Admitting: Cardiology

## 2024-06-08 ENCOUNTER — Ambulatory Visit (INDEPENDENT_AMBULATORY_CARE_PROVIDER_SITE_OTHER): Payer: Medicare (Managed Care) | Admitting: Cardiology

## 2024-06-08 ENCOUNTER — Telehealth (HOSPITAL_BASED_OUTPATIENT_CLINIC_OR_DEPARTMENT_OTHER): Payer: Self-pay | Admitting: *Deleted

## 2024-06-08 ENCOUNTER — Ambulatory Visit (INDEPENDENT_AMBULATORY_CARE_PROVIDER_SITE_OTHER): Payer: Medicare (Managed Care)

## 2024-06-08 ENCOUNTER — Other Ambulatory Visit (HOSPITAL_BASED_OUTPATIENT_CLINIC_OR_DEPARTMENT_OTHER): Payer: Medicare (Managed Care)

## 2024-06-08 ENCOUNTER — Encounter (HOSPITAL_BASED_OUTPATIENT_CLINIC_OR_DEPARTMENT_OTHER): Payer: Self-pay | Admitting: Cardiology

## 2024-06-08 VITALS — BP 100/60 | HR 81 | Ht 66.0 in | Wt 128.3 lb

## 2024-06-08 DIAGNOSIS — I2510 Atherosclerotic heart disease of native coronary artery without angina pectoris: Secondary | ICD-10-CM | POA: Diagnosis not present

## 2024-06-08 DIAGNOSIS — I340 Nonrheumatic mitral (valve) insufficiency: Secondary | ICD-10-CM | POA: Diagnosis not present

## 2024-06-08 DIAGNOSIS — N1831 Chronic kidney disease, stage 3a: Secondary | ICD-10-CM

## 2024-06-08 DIAGNOSIS — I5042 Chronic combined systolic (congestive) and diastolic (congestive) heart failure: Secondary | ICD-10-CM

## 2024-06-08 DIAGNOSIS — I428 Other cardiomyopathies: Secondary | ICD-10-CM

## 2024-06-08 LAB — ECHOCARDIOGRAM COMPLETE
Area-P 1/2: 3.53 cm2
Height: 66 in
MV M vel: 4.39 m/s
MV Peak grad: 77.1 mmHg
Radius: 0.6 cm
S' Lateral: 4.38 cm
Weight: 2052.8 [oz_av]

## 2024-06-08 NOTE — Progress Notes (Signed)
 Cardiology Office Note:  .   Date:  06/08/2024  ID:  Cody [name], DOB 1951/03/23, MRN [id number] PCP: Cody Annabella CROME, DO  [facility] Providers Cardiologist:  Cody Shallow, MD {  History of Present Illness: .   Cody [name] is a 73 y.o. male NICM, chronic systolic and diastolic heart failure, nonobstructive CAD. I met him during his hospitalization in 02/2024 for acute systolic and diastolic heart failure.  Pertinent CV history: Admitted 01/2024 for acute heart failure. EF 30-35% with focal wall motion abnormalities, but cath with nonobstructive CAD.   Today: Here with his brother Cody [name] (who is also my patient). Reviewed recent history. Last in the ER 03/06/24 for fluid overload. Feels short of breath with minimal activity, such as walking to the car, moving a few feet across the room. Doesn't weigh himself regularly. No edema or fluid. Brother notes that he has been minimally active for a long time, so difficult to tell what is a change in terms of dyspnea on exertion.  [facility] list from [facility] does not include lasix  or potassium. He takes 4 pills every morning. Based on his medication list, this appears to be Jardiance , metoprolol , and rosuvastatin . He is taking a 4th pill, unclear what this is. He thinks this might be a fluid pill. Not taking aspirin .   ROS: Denies chest pain. No PND, orthopnea, LE edema or unexpected weight gain. No syncope or palpitations. ROS otherwise negative except as noted.   Studies Reviewed: SABRA    EKG:       Physical Exam:   VS:  BP 100/60   Pulse 81   Ht 5' 6 (1.676 m)   Wt 128 lb 4.8 oz (58.2 kg)   SpO2 99%   BMI 20.71 kg/m    Wt Readings from Last 3 Encounters:  06/08/24 128 lb 4.8 oz (58.2 kg)  03/06/24 130 lb (59 kg)  03/05/24 133 lb 3.2 oz (60.4 kg)    GEN: Well nourished, well developed in no acute distress HEENT: Normal, moist mucous membranes NECK: No JVD CARDIAC: regular rhythm, normal S1 and S2, +S3, no rubs or gallops. 1/6  systolic murmur. VASCULAR: Radial and DP pulses 2+ bilaterally. No carotid bruits RESPIRATORY:  Clear to auscultation without rales, wheezing or rhonchi  ABDOMEN: Soft, non-tender, non-distended MUSCULOSKELETAL:  In wheelchair SKIN: Warm and dry, no edema NEUROLOGIC:  Alert and oriented x 3. No focal neuro deficits noted. PSYCHIATRIC:  Normal affect    ASSESSMENT AND PLAN: .    Acute systolic and diastolic heart failure Nonischemic cardiomyopathy -reports being off medications for some time prior to presentation -EF 30-35% with focal wall motion abnormalities inferior/inferolateral -we were able to arrange for urgent same day echo. This shows that RA and RV pressures are normal, but both right and left sided function is worse than prior -RHC 6/2: CO 3.7, CI 2.2. RAP 9, wedge 23, PA 57/23 (mean 35), PVR 3.2. LVEDP 27-31. -GDMT:             -on Jardiance  10 mg daily             -carvedilol  3.125 mg BID             -no ACEi/ARB/ARNI/MRA due to low blood pressures, also had AKI on CKD 3b during admission  -on lasix  40 mg daily on our reports, but not listed on his [facility] medications. He will compare our medication list with what he is taking at home.  Reviewed notes from [facility],  which states he is DNR (form present) and wants to avoid rehospitalization if possible.   He reports recent labs drawn at [facility], wants to avoid a blood draw today. We have called the office and asked these to be faxed to us .  Overall we are in a difficult position. His low blood pressure prevents optimization of GDMT. He does not appear volume up on exam, and his RA/RV pressures are normal. His DOE may be to elevated left sided pressures, but we cannot afterload reduce him (see blood pressure limitations).  Given the above, if he is not currently taking lasix , would use only PRN and not add to his regimen. Alternatively, if he is taking it, would continue as he appears euvolemic on exam today (ie would keep current  regimen he is currently on).   CAD, nonobstructive by cath 02/10/24 -mild-moderate disease of mLAD, RPLV, and RCA -continue rosuvastatin , aspirin    Moderate to severe mitral regurgitation -on my review, appears moderate and secondary to LV dilation (initially reported as severe) -of note, has esophageal stricture history, dilated 09/2023, pertinent if TEE considered in the future   Type II diabetes: on Jardiance  as above   chronic kidney disease stage 3a -requesting recents labs from [facility]  Total time of encounter: I spent 48 minutes dedicated to the care of this patient on the date of this encounter to include pre-visit review of records, face-to-face time with the patient discussing conditions above, and clinical documentation with the electronic health record. We specifically spent time today discussing symptoms, workup, possible etiologies, available management options, limitations due to blood pressure   Dispo: 6-8 weeks  Signed, Shelda Bruckner, MD   Shelda Bruckner, MD, PhD, [facility]  [facility]  [place]  [facility] at [facility] at [facility] [address] [place], [place] [address] [phone]

## 2024-06-08 NOTE — Patient Instructions (Signed)
 Medication Instructions:   See special instructions.   *If you need a refill on your cardiac medications before your next appointment, please call your pharmacy*  Lab Work:  None ordered.  If you have labs (blood work) drawn today and your tests are completely normal, you will receive your results only by: MyChart Message (if you have MyChart) OR A paper copy in the mail If you have any lab test that is abnormal or we need to change your treatment, we will call you to review the results.  Testing/Procedures:  Your physician has requested that you have an echocardiogram. Echocardiography is a painless test that uses sound waves to create images of your heart. It provides your doctor with information about the size and shape of your heart and how well your heart's chambers and valves are working. This procedure takes approximately one hour. There are no restrictions for this procedure. Please do NOT wear cologne, perfume, aftershave, or lotions (deodorant is allowed). Please arrive 15 minutes prior to your appointment time.   Follow-Up: At [facility], you and your health needs are our priority.  As part of our continuing mission to provide you with exceptional heart care, our providers are all part of one team.  This team includes your primary Cardiologist (physician) and Advanced Practice Providers or APPs (Physician Assistants and Nurse Practitioners) who all work together to provide you with the care you need, when you need it.  Your next appointment:   7 week(s)  Provider:   Reche Finder, NP    We recommend signing up for the patient portal called MyChart.  Sign up information is provided on this After Visit Summary.  MyChart is used to connect with patients for Virtual Visits (Telemedicine).  Patients are able to view lab/test results, encounter notes, upcoming appointments, etc.  Non-urgent messages can be sent to your provider as well.   To learn more about what  you can do with MyChart, go to [email or web address].   Other Instructions        I want you to check and see if you are taking furosemide  (little white pill usually). I want to know if you are taking potassium too.   I want to check your kidney function, potassium, and fluid levels today in bloodwork to see how we are doing. But, if [facility] has just checked these, we don't need to duplicate. I will call them to see if I can get recent bloodwork. If not, we will have you come back here.   Medications you should be taking based on our list: Aspirin  (though you report not taking this) Jardiance  (empagliflozin ) Metoprolol  succinate Rosuvastatin   Check to see if this is in your pill pack: Furosemide  Potassium chloride 

## 2024-06-08 NOTE — Telephone Encounter (Signed)
 Called [facility] at [phone] to get recent labs faxed to office.

## 2024-06-09 ENCOUNTER — Telehealth (HOSPITAL_BASED_OUTPATIENT_CLINIC_OR_DEPARTMENT_OTHER): Payer: Self-pay | Admitting: Cardiology

## 2024-06-09 NOTE — Telephone Encounter (Signed)
 Labs in system from 06/06/23. Routed via Epic to  Dr. Lonni.

## 2024-06-09 NOTE — Telephone Encounter (Signed)
 Pts sister in law dropped off SSA form to be filled out and given back by Oct 6th 2025. Please fill out at call Pam at [phone], she will pick up once complete. Will be in providers box by eod.

## 2024-07-06 ENCOUNTER — Other Ambulatory Visit (HOSPITAL_BASED_OUTPATIENT_CLINIC_OR_DEPARTMENT_OTHER): Payer: Medicare (Managed Care)

## 2024-07-26 ENCOUNTER — Encounter (HOSPITAL_BASED_OUTPATIENT_CLINIC_OR_DEPARTMENT_OTHER): Payer: Self-pay | Admitting: Family

## 2024-07-26 NOTE — Progress Notes (Unsigned)
 Cardiology Office Note:    Date:  07/27/2024   ID:  Cody [name], DOB 06/23/1951, MRN [id number]  PCP:  Cloria Annabella CROME, DO   [facility] Providers Cardiologist:  Redell Shallow, MD     Referring MD: Cloria Annabella CROME, DO   Chief complaint: Follow-up of NICM     History of Present Illness:   Cody [name] is a 73 y.o. male with a hx of NICM, chronic systolic and diastolic heart failure, nonobstructive CAD, HTN, T2DM, memory impairment/medication confusion/nonadherence presenting to clinic for follow-up of his chronic cardiovascular conditions.  Presented to the emergency department on 02/06/2024 with complaints of dyspnea over the prior several weeks.  Recently moved to the area from [place], reported history of heart conditions but could not remember which.  BNP of 1044, HS troponins of 81>> 95.  2D echo revealed EF of 30-35% with inferior lateral akinesis and severe mitral regurgitation.  Cardiac catheterization on 02/10/2024 showing nonobstructive CAD, elevated PVR at 3.2 Woods units, mildly reduced cardiac index at 2.2 L/min.  MR was felt to be secondary to LV dilation.  Lisinopril , Coreg , spironolactone , Lasix , atorvastatin  discontinued.  Started on metoprolol , rosuvastatin , Jardiance .  Advanced HF clinic started Lasix  40 mg daily 02/17/2024.  Reported at OV on 03/05/2024 that he lives at home alone, is not very active/uses a walker for assistance, and receives intermittent assistant from [facility] and family members.  Repeat BNP at OV was elevated to 17,000, patient instructed to go to the ED.  In ED on 03/06/2024 CXR was clear, repeat BNP was 1000, suspected to be a lab error, did not appear to be fluid overloaded in the ED.  Most recent OV with Dr. Lonni on 06/08/2024 patient was stable.  Repeat echo ordered. Echo 06/08/2024: LVEF 25-30%, LV with severely decreased function, global hypokinesis, indeterminate diastolic parameters, RV systolic function severely reduced, RV  moderately enlarged, mildly increased RV wall thickness, normal PASP, RVSP 25.8 mmHg, moderate mitral valve regurgitation, AV regurgitation is trivial, RA pressure 3 mmHg.  Previously unable to optimize GDMT for HFrEF 2/2 hypotension.   Presents with his brother to clinic, reports feeling more short of breath over the last month.  Occasionally feels short of breath at rest with a sensation of needing to take a deep breath.  Also reports occasional sensations of lightheadedness when ambulating with his walker.  Denies chest pain, palpitations, dizziness, near syncope, dark/tarry/bloody stools, hematuria, weight gain, or edema.  Lives alone, states he is able to vacuum, do the dishes, and take care of himself.  He is frustrated with all of the doctors office visits, blood work, and tests that he has to have.  ROS:   Please see the history of present illness.    All other systems reviewed and are negative.     Past Medical History:  Diagnosis Date   Arthritis    Balance problem    Cataract    Chronic combined systolic and diastolic heart failure (HCC)    Coronary artery disease    Depression    Diabetes mellitus without complication (HCC)    Diabetic neuropathy (HCC) 02/19/2019   Esophageal stricture    Glaucoma    Hiatal hernia    Hyperlipidemia    Hypertension    Nonischemic cardiomyopathy (HCC)    Numbness     Past Surgical History:  Procedure Laterality Date   COLONOSCOPY     CYST REMOVAL NECK     States he was young   FRACTURE  SURGERY     Left foot    GANGLION CYST EXCISION Left    RIGHT/LEFT HEART CATH AND CORONARY ANGIOGRAPHY N/A 02/10/2024   Procedure: RIGHT/LEFT HEART CATH AND CORONARY ANGIOGRAPHY;  Surgeon: Wendel Lurena POUR, MD;  Location: [facility];  Service: Cardiovascular;  Laterality: N/A;   TONSILLECTOMY      Current Medications: Current Meds  Medication Sig   acetaminophen  (TYLENOL ) 500 MG tablet Take 1,000 mg by mouth every 8 (eight) hours as needed  (pain).   aspirin  81 MG chewable tablet Chew 81 mg by mouth daily.   brimonidine (ALPHAGAN) 0.2 % ophthalmic solution 1 drop 3 (three) times daily.   empagliflozin  (JARDIANCE ) 10 MG TABS tablet Take 10 mg by mouth daily.   furosemide  (LASIX ) 40 MG tablet Take 1 tablet (40 mg total) by mouth daily.   metoprolol  succinate (TOPROL -XL) 25 MG 24 hr tablet Take 25 mg by mouth daily.   potassium chloride  (KLOR-CON ) 10 MEQ tablet Take 1 tablet (10 mEq total) by mouth daily.   Rosuvastatin  Calcium  40 MG CPSP Take 40 mg by mouth daily.     Allergies:   Patient has no known allergies.   Social History   Socioeconomic History   Marital status: Single    Spouse name: Not on file   Number of children: 0   Years of education: Not on file   Highest education level: 12th grade  Occupational History   Occupation: Retired   Tobacco Use   Smoking status: Never    Passive exposure: Past   Smokeless tobacco: Current    Types: Chew  Vaping Use   Vaping status: Never Used  Substance and Sexual Activity   Alcohol use: Not Currently   Drug use: Never   Sexual activity: Not Currently  Other Topics Concern   Not on file  Social History Narrative   Right handed    Caffeine 1-2 cups daily    Lives at home with brother [name]    Social Drivers of Health   Financial Resource Strain: Not on file  Food Insecurity: No Food Insecurity (07/27/2024)   Hunger Vital Sign    Worried About Running Out of Food in the Last Year: Never true    Ran Out of Food in the Last Year: Never true  Transportation Needs: No Transportation Needs (02/11/2024)   PRAPARE - Administrator, Civil Service (Medical): No    Lack of Transportation (Non-Medical): No  Physical Activity: Not on file  Stress: Not on file  Social Connections: Unknown (02/07/2024)   Social Connection and Isolation Panel    Frequency of Communication with Friends and Family: Three times a week    Frequency of Social Gatherings with Friends and  Family: Three times a week    Attends Religious Services: Patient declined    Active Member of Clubs or Organizations: Yes    Attends Engineer, Structural: More than 4 times per year    Marital Status: Divorced     Family History: The patient's family history includes Diabetes in his brother; Heart failure in his brother and father; Hypertension in his brother; Obesity in his mother. There is no history of Colon cancer, Esophageal cancer, Pancreatic cancer, Stomach cancer, Rectal cancer, or Colon polyps.  EKGs/Labs/Other Studies Reviewed:    The following studies were reviewed today:       Recent Labs: 02/08/2024: TSH 5.591 02/11/2024: Magnesium 2.0 03/05/2024: NT-Pro BNP 17,286 03/06/2024: B Natriuretic Peptide 1,093.4; Hemoglobin 13.2;  Platelets 267 03/19/2024: BUN 34; Creatinine, Ser 1.45; Potassium 4.8; Sodium 139  Recent Lipid Panel    Component Value Date/Time   CHOL 93 02/07/2024 0200   CHOL 242 (H) 10/03/2018 1053   TRIG 175 (H) 02/07/2024 0200   HDL 38 (L) 02/07/2024 0200   HDL 38 (L) 10/03/2018 1053   CHOLHDL 2.4 02/07/2024 0200   VLDL 35 02/07/2024 0200   LDLCALC 20 02/07/2024 0200   LDLCALC 161 (H) 10/03/2018 1053     Physical Exam:    VS:  BP 108/62 (BP Location: Left Arm, Patient Position: Sitting, Cuff Size: Normal)   Pulse 80   Ht 5' 6 (1.676 m)   Wt 130 lb (59 kg)   SpO2 93%   BMI 20.98 kg/m        Wt Readings from Last 3 Encounters:  07/27/24 130 lb (59 kg)  06/08/24 128 lb 4.8 oz (58.2 kg)  03/06/24 130 lb (59 kg)     GEN: Thin, disheveled, in no acute distress HEENT: Normal NECK:  No carotid bruits CARDIAC:  S1-S2 normal, RRR, no murmurs, rubs, gallops RESPIRATORY:  Clear to auscultation without rales, wheezing or rhonchi  MUSCULOSKELETAL:  No edema; No deformity  SKIN: Warm and dry NEUROLOGIC:  Alert and oriented x 3 PSYCHIATRIC:  Normal affect       Assessment & Plan Nonischemic cardiomyopathy (HCC) Chronic combined  systolic and diastolic heart failure (HCC) Echo 06/08/2024: LVEF 25-30%, LV with severely decreased function, global hypokinesis, indeterminate diastolic parameters, RV systolic function severely reduced, RV moderately enlarged, mildly increased RV wall thickness, normal PASP, RVSP 25.8 mmHg, moderate mitral valve regurgitation, AV regurgitation is trivial, RA pressure 3 mmHg. Reports increasing SOB over the last month, and occasional lightheadedness with positional changes Denies CP, palpitations, near-syncope, leg swelling Does not check his weights regularly at home, checks them when he goes to [facility] twice weekly, weight has increased by 2 pounds since previous visit 2 months earlier. Appears euvolemic on exam today, able to take care of himself and perform ADLs. Recommended decreasing his daily Lasix  and potassium, adding spironolactone , and ordering a BMP and BNP Patient denied, stating he did not want to change any of his medicines and that he did not want any further blood work at this time.  That he would let us  know when he feels bad and wants further workup. We discussed the option of palliative care, which patient was willing to consider.  However, he feels he gets those services provided mostly through [facility] at this time, and will let us  know if he wishes to move forward with a palliative care consult in the future. Continue Jardiance  10 mg daily Continue Lasix  40 mg daily with potassium 10 mEq (potassium stable on previous labs, kidney function mildly decreased, patient refuses further lab draws today) Continue metoprolol  succinate 25 mg daily Coronary artery disease involving native coronary artery of native heart without angina pectoris Cardiac catheterization on 02/10/2024 showing nonobstructive CAD, elevated PVR at 3.2 Woods units, mildly reduced cardiac index at 2.2 L/min. Denies chest pain, palpitations SOB likely 2/2 HFrEF, nonischemic in nature Continue aspirin  EC 81 mg  daily Continue rosuvastatin  40 mg daily Stage 3a chronic kidney disease (HCC) Kidney function mildly decreased on previous lab draw, creatinine 1.45, eGFR 50 Patient refuses further lab draws today to continue monitoring Will reach out if he has new/concerning symptoms in the future and wishes for further treatment  Considering patient currently does not wish for active intervention in his  care at this time, we will postpone follow-up, and scheduled for 4 months out to reevaluate plan of care at that time          Medication Adjustments/Labs and Tests Ordered: Current medicines are reviewed at length with the patient today.  Concerns regarding medicines are outlined above.  Orders Placed This Encounter  Procedures   Brain natriuretic peptide   No orders of the defined types were placed in this encounter.   Patient Instructions  Medication Instructions:   Your physician recommends that you continue on your current medications as directed. Please refer to the Current Medication list given to you today.    Labwork:  WE WILL CONTACT [facility] AND HAVE THEM CHECK RECOMMENDED LAB WORK ON YOU--BNP---[facility] TO FAX RESULTS TO [phone] ATTN: MIRIAM SHAMS, NP      Follow-Up:  4 MONTHS WITH DR. LONNI    Special Instructions:    PLEASE LET US  KNOW IF YOU SHORTNESS OF BREATH INCREASES  Recommend weighing daily and keeping a log. Please call our office if you have weight gain of 2 pounds overnight or 5 pounds in 1 week.   Date  Time Weight                                              Signed, Miriam FORBES Shams, NP  07/27/2024 12:47 PM    [facility]

## 2024-07-27 ENCOUNTER — Encounter (HOSPITAL_BASED_OUTPATIENT_CLINIC_OR_DEPARTMENT_OTHER): Payer: Self-pay | Admitting: Emergency Medicine

## 2024-07-27 ENCOUNTER — Ambulatory Visit (INDEPENDENT_AMBULATORY_CARE_PROVIDER_SITE_OTHER): Payer: Medicare (Managed Care) | Admitting: Emergency Medicine

## 2024-07-27 VITALS — BP 108/62 | HR 80 | Ht 66.0 in | Wt 130.0 lb

## 2024-07-27 DIAGNOSIS — R413 Other amnesia: Secondary | ICD-10-CM

## 2024-07-27 DIAGNOSIS — N1831 Chronic kidney disease, stage 3a: Secondary | ICD-10-CM

## 2024-07-27 DIAGNOSIS — I2510 Atherosclerotic heart disease of native coronary artery without angina pectoris: Secondary | ICD-10-CM | POA: Diagnosis not present

## 2024-07-27 DIAGNOSIS — I428 Other cardiomyopathies: Secondary | ICD-10-CM

## 2024-07-27 DIAGNOSIS — I5042 Chronic combined systolic (congestive) and diastolic (congestive) heart failure: Secondary | ICD-10-CM

## 2024-07-27 NOTE — Patient Instructions (Addendum)
 Medication Instructions:   Your physician recommends that you continue on your current medications as directed. Please refer to the Current Medication list given to you today.    Labwork:  WE WILL CONTACT [facility] AND HAVE THEM CHECK RECOMMENDED LAB WORK ON YOU--BNP---[facility] TO FAX RESULTS TO [phone] ATTN: MIRIAM SHAMS, NP      Follow-Up:  4 MONTHS WITH DR. LONNI    Special Instructions:    PLEASE LET US  KNOW IF YOU SHORTNESS OF BREATH INCREASES  Recommend weighing daily and keeping a log. Please call our office if you have weight gain of 2 pounds overnight or 5 pounds in 1 week.   Date  Time Weight

## 2024-08-18 ENCOUNTER — Encounter: Payer: Self-pay | Admitting: Gastroenterology

## 2024-11-26 ENCOUNTER — Ambulatory Visit (HOSPITAL_BASED_OUTPATIENT_CLINIC_OR_DEPARTMENT_OTHER): Payer: Medicare (Managed Care) | Admitting: Cardiology
